# Patient Record
Sex: Female | Born: 1995 | Race: Black or African American | Hispanic: No | Marital: Single | Smoking: Never smoker
Health system: Southern US, Community
[De-identification: ages and names within clinical notes are randomized; demographics above are authoritative.]

## PROBLEM LIST (undated history)

## (undated) DIAGNOSIS — Z789 Other specified health status: Secondary | ICD-10-CM

## (undated) DIAGNOSIS — R000 Tachycardia, unspecified: Secondary | ICD-10-CM

## (undated) HISTORY — PX: NO PAST SURGERIES: SHX2092

---
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
## 2016-02-02 ENCOUNTER — Ambulatory Visit (INDEPENDENT_AMBULATORY_CARE_PROVIDER_SITE_OTHER): Payer: BLUE CROSS/BLUE SHIELD | Admitting: Family Medicine

## 2016-02-02 VITALS — BP 100/60 | HR 74 | Temp 98.1°F | Resp 20 | Ht 64.0 in | Wt 134.9 lb

## 2016-02-02 DIAGNOSIS — M25561 Pain in right knee: Secondary | ICD-10-CM | POA: Diagnosis not present

## 2016-02-02 DIAGNOSIS — Z3009 Encounter for other general counseling and advice on contraception: Secondary | ICD-10-CM | POA: Diagnosis not present

## 2016-02-02 DIAGNOSIS — N898 Other specified noninflammatory disorders of vagina: Secondary | ICD-10-CM | POA: Diagnosis not present

## 2016-02-02 LAB — POCT URINALYSIS DIP (MANUAL ENTRY)
BILIRUBIN UA: NEGATIVE
Bilirubin, UA: NEGATIVE
Blood, UA: NEGATIVE
Glucose, UA: NEGATIVE
Nitrite, UA: NEGATIVE
PH UA: 7.5
Spec Grav, UA: 1.015
Urobilinogen, UA: 2

## 2016-02-02 LAB — POCT WET + KOH PREP
Trich by wet prep: ABSENT
Yeast by KOH: ABSENT
Yeast by wet prep: ABSENT

## 2016-02-02 LAB — POCT URINE PREGNANCY: PREG TEST UR: NEGATIVE

## 2016-02-02 MED ORDER — CEPHALEXIN 500 MG PO CAPS: 500.0000 mg | ORAL_CAPSULE | Freq: Two times a day (BID) | ORAL | Status: DC

## 2016-02-02 MED ORDER — NORGESTIMATE-ETH ESTRADIOL 0.25-35 MG-MCG PO TABS: 1.0000 | ORAL_TABLET | Freq: Every day | ORAL | Status: DC

## 2016-02-02 NOTE — Progress Notes (Signed)
Nancy Mcconnell is a 20 y.o. female who presents to Urgent Care today for right knee pain and vaginal discharge and urinary frequency urgency and dysuria.  1) right knee pain present for 2 weeks. Patient twisted her knee 2 weeks ago. She notes pain and swelling. The pains whenever improved a bit. However she does note medial knee pain worse with activity better with rest. No radiating pain weakness or numbness fevers or chills. She's tried some ibuprofen which has helped a bit.  2) vaginal discharge: Patient has mild vaginal discharge associated with some urinary frequency and urgency and mild dysuria. She thinks she has BV. She has had BV in the past. She has had some unprotected sex recently with her boyfriend who has been tested negative for STDs. She uses the pullout method for birth control. Her last menstrual period was one week ago.   No past medical history on file. No past surgical history on file. Social History  Substance Use Topics  . Smoking status: Never Smoker   . Smokeless tobacco: Not on file  . Alcohol Use: No   ROS as above Medications: Current Outpatient Prescriptions  Medication Sig Dispense Refill  . cephALEXin (KEFLEX) 500 MG capsule Take 1 capsule (500 mg total) by mouth 2 (two) times daily. 14 capsule 0  . Diphenhyd-Hydrocort-Nystatin (FIRST-DUKES MOUTHWASH) SUSP Use as directed 5 mLs in the mouth or throat 4 (four) times daily as needed. (Patient not taking: Reported on 02/02/2016) 120 mL 1  . loperamide (IMODIUM A-D) 2 MG tablet Take 1 tablet (2 mg total) by mouth 4 (four) times daily as needed for diarrhea or loose stools. (Patient not taking: Reported on 02/02/2016) 30 tablet 0  . Multiple Vitamins-Minerals (MULTIVITAMIN WITH MINERALS) tablet Take 1 tablet by mouth daily. Reported on 02/02/2016    . norgestimate-ethinyl estradiol (ORTHO-CYCLEN,SPRINTEC,PREVIFEM) 0.25-35 MG-MCG tablet Take 1 tablet by mouth daily. Use as directed. 1 Package 11   No current  facility-administered medications for this visit.   No Known Allergies   Exam:  BP 100/60 mmHg  Pulse 74  Temp(Src) 98.1 F (36.7 C) (Oral)  Resp 20  Ht  (1.626 m)  Wt 134 lb 14.4 oz (61.19 kg)  BMI 23.14 kg/m2  SpO2 99%  LMP 01/29/2016 Gen: Well NAD HEENT: EOMI,  MMM Lungs: Normal work of breathing. CTABL Heart: RRR no MRG Abd: NABS, Soft. Nondistended, Nontender Exts: Brisk capillary refill, warm and well perfused.  GYN: Normal external genitalia. No visible discharge. Patient declines speculum vaginal exam. Blind wet prep swab was obtained.  Right knee: Normal-appearing no effusion Range of motion 0-120 with no crepitations. Mildly tender to palpation medial joint line nontender laterally. Stable ligamentous exam. Positive medial McMurray's test.  Results for orders placed or performed in visit on 02/02/16 (from the past 24 hour(s))  POCT urine pregnancy     Status: Normal   Collection Time: 02/02/16  4:57 PM  Result Value Ref Range   Preg Test, Ur Negative Negative  POCT urinalysis dipstick     Status: Abnormal   Collection Time: 02/02/16  4:58 PM  Result Value Ref Range   Color, UA yellow yellow   Clarity, UA clear clear   Glucose, UA negative negative   Bilirubin, UA negative negative   Ketones, POC UA negative negative   Spec Grav, UA 1.015    Blood, UA negative negative   pH, UA 7.5    Protein Ur, POC trace (A) negative   Urobilinogen, UA 2.0  Nitrite, UA Negative Negative   Leukocytes, UA small (1+) (A) Negative  POCT Wet + KOH Prep     Status: Abnormal   Collection Time: 02/02/16  5:05 PM  Result Value Ref Range   Yeast by KOH Absent Present, Absent   Yeast by wet prep Absent Present, Absent   WBC by wet prep Few None, Few, Too numerous to count   Clue Cells Wet Prep HPF POC None None, Too numerous to count   Trich by wet prep Absent Present, Absent   Bacteria Wet Prep HPF POC Moderate (A) None, Few, Too numerous to count   Epithelial Cells  By [employer] Financial[employer]et Pref ([facility]) Many (A) None, Few, Too numerous to count   RBC,UR,HPF,POC None None RBC/hpf   No results found.  Assessment and Plan: 20 y.o. female with   1) right knee pain: Concerning for meniscus injury. Follow up with sports medicine for x-ray and likely MRI.  2) dysuria: Likely UTI based on small leukocyte esterase. Treat empirically with Keflex. Urine culture pending.  3) Wet prep normal. Pt will benefit from Urine CG/Chlamydia the follow-up visit  Discussed warning signs or symptoms. Please see discharge instructions. Patient expresses understanding.

## 2016-02-02 NOTE — Patient Instructions (Addendum)
Thank you for coming in today. Follow up with me soon.   Follow up with me Dr [facility] Address: [address] [place], [place] [address] Phone: [phone]   Start birth control pills before he becomes sexually active. We'll be happy to discuss further birth control options at your follow-up visit.  Looks like you have a urinary tract infection. Take Keflex twice daily for one week   Meniscus Tear With Phase I Rehab The meniscus is a C-shaped cartilage structure, located in the knee joint between the thigh bone (femur) and the shinbone (tibia). Two menisci are located in each knee joint: the inner and outer meniscus. The meniscus acts as an adapter between the thigh bone and shinbone, allowing them to fit properly together. It also functions as a shock absorber, to reduce the stress placed on the knee joint and to help supply nutrients to the knee joint cartilage. As people age, the meniscus begins to harden and become more vulnerable to injury. Meniscus tears are a common injury, especially in older athletes. Inner meniscus tears are more common than outer meniscus tears.  SYMPTOMS   Pain in the knee, especially with standing or squatting with the affected leg.  Tenderness along the joint line.  Swelling in the knee joint (effusion), usually starting 1 to 2 days after injury.  Locking or catching of the knee joint, causing inability to straighten the knee completely.  Giving way or buckling of the knee. CAUSES  A meniscus tear occurs when a force is placed on the meniscus that is greater than it can handle. Common causes of injury include:  Direct hit (trauma) to the knee.  Twisting, pivoting, or cutting (rapidly changing direction while running), kneeling or squatting.  Without injury, due to aging. RISK INCREASES WITH:  Contact sports (football, rugby).  Sports in which cleats are used with pivoting (soccer, lacrosse) or sports in which good  shoe grip and sudden change in direction are required (racquetball, basketball, squash).  Previous knee injury.  Associated knee injury, particularly ligament injuries.  Poor strength and flexibility. PREVENTION  Warm up and stretch properly before activity.  Maintain physical fitness:  Strength, flexibility, and endurance.  Cardiovascular fitness.  Protect the knee with a brace or elastic bandage.  Wear properly fitted protective equipment (proper cleats for the surface). PROGNOSIS  Sometimes, meniscus tears heal on their own. However, definitive treatment requires surgery, followed by at least 6 weeks of recovery.  RELATED COMPLICATIONS   Recurring symptoms that result in a chronic problem.  Repeated knee injury, especially if sports are resumed too soon after injury or surgery.  Progression of the tear (the tear gets larger), if untreated.  Arthritis of the knee in later years (with or without surgery).  Complications of surgery, including infection, bleeding, injury to nerves (numbness, weakness, paralysis) continued pain, giving way, locking, nonhealing of meniscus (if repaired), need for further surgery, and knee stiffness (loss of motion). TREATMENT  Treatment first involves the use of ice and medicine, to reduce pain and inflammation. You may find using crutches to walk more comfortable. However, it is okay to bear weight on the injured knee, if the pain will allow it. Surgery is often advised as a definitive treatment. Surgery is performed through an incision near the joint (arthroscopically). The torn piece of the meniscus is removed, and if possible the joint cartilage is repaired. After surgery, the joint must be restrained. After restraint, it is important to perform strengthening and  stretching exercises to help regain strength and a full range of motion. These exercises may be completed at home or with a therapist.  MEDICATION  If pain medicine is needed,  nonsteroidal anti-inflammatory medicines (aspirin and ibuprofen), or other minor pain relievers (acetaminophen), are often advised.  Do not take pain medicine for 7 days before surgery.  Prescription pain relievers may be given, if your caregiver thinks they are needed. Use only as directed and only as much as you need. HEAT AND COLD  Cold treatment (icing) should be applied for 10 to 15 minutes every 2 to 3 hours for inflammation and pain, and immediately after activity that aggravates your symptoms. Use ice packs or an ice massage.  Heat treatment may be used before performing stretching and strengthening activities prescribed by your caregiver, physical therapist, or athletic trainer. Use a heat pack or a warm water soak. SEEK MEDICAL CARE IF:   Symptoms get worse or do not improve in 2 weeks, despite treatment.  New, unexplained symptoms develop. (Drugs used in treatment may produce side effects.) EXERCISES RANGE OF MOTION (ROM) AND STRETCHING EXERCISES - Meniscus Tear, Non-operative, Phase I These are some of the initial exercises with which you may start your rehabilitation program, until you see your caregiver again or until your symptoms are resolved. Remember:   These initial exercises are intended to be gentle. They will help you restore motion without increasing any swelling.  Completing these exercises allows less painful movement and prepares you for the more aggressive strengthening exercises in Phase II.  An effective stretch should be held for at least 30 seconds.  A stretch should never be painful. You should only feel a gentle lengthening or release in the stretched tissue. RANGE OF MOTION - Knee Flexion, Active  Lie on your back with both knees straight. (If this causes back discomfort, bend your healthy knee, placing your foot flat on the floor.)  Slowly slide your heel back toward your buttocks until you feel a gentle stretch in the front of your knee or  thigh.  Hold for __________ seconds. Slowly slide your heel back to the starting position. Repeat __________ times. Complete this exercise __________ times per day.  RANGE OF MOTION - Knee Flexion and Extension, Active-Assisted  Sit on the edge of a table or chair with your thighs firmly supported. It may be helpful to place a folded towel under the end of your right / left thigh.  Flexion (bending): Place the ankle of your healthy leg on top of the other ankle. Use your healthy leg to gently bend your right / left knee until you feel a mild tension across the top of your knee.  Hold for __________ seconds.  Extension (straightening): Switch your ankles so your right / left leg is on top. Use your healthy leg to straighten your right / left knee until you feel a mild tension on the backside of your knee.  Hold for __________ seconds. Repeat __________ times. Complete __________ times per day. STRETCH - Knee Flexion, Supine  Lie on the floor with your right / left heel and foot lightly touching the wall. (Place both feet on the wall if you do not use a door frame.)  Without using any effort, allow gravity to slide your foot down the wall slowly until you feel a gentle stretch in the front of your right / left knee.  Hold this stretch for __________ seconds. Then return the leg to the starting position, using your healthy  leg for help, if needed. Repeat __________ times. Complete this stretch __________ times per day.  STRETCH - Knee Extension Sitting  Sit with your right / left leg/heel propped on another chair, coffee table, or foot stool.  Allow your leg muscles to relax, letting gravity straighten out your knee.*  You should feel a stretch behind your right / left knee. Hold this position for __________ seconds. Repeat __________ times. Complete this stretch __________ times per day.  *Your physician, physical therapist or athletic trainer may instruct you place a __________ weight  on your thigh, just above your kneecap, to deepen the stretch.  STRENGTHENING EXERCISES - Meniscus Tear, Non-operative, Phase I These exercises may help you when beginning to rehabilitate your injury. They may resolve your symptoms with or without further involvement from your physician, physical therapist or athletic trainer. While completing these exercises, remember:   Muscles can gain both the endurance and the strength needed for everyday activities through controlled exercises.  Complete these exercises as instructed by your physician, physical therapist or athletic trainer. Progress the resistance and repetitions only as guided. STRENGTH - Quadriceps, Isometrics  Lie on your back with your right / left leg extended and your opposite knee bent.  Gradually tense the muscles in the front of your right / left thigh. You should see either your knee cap slide up toward your hip or increased dimpling just above the knee. This motion will push the back of the knee down toward the floor, mat, or bed on which you are lying.  Hold the muscle as tight as you can, without increasing your pain, for __________ seconds.  Relax the muscles slowly and completely between each repetition. Repeat __________ times. Complete this exercise __________ times per day.  STRENGTH - Quadriceps, Short Arcs   Lie on your back. Place a __________ inch towel roll under your right / left knee, so that the knee bends slightly.  Raise only your lower leg by tightening the muscles in the front of your thigh. Do not allow your thigh to rise.  Hold this position for __________ seconds. Repeat __________ times. Complete this exercise __________ times per day.  OPTIONAL ANKLE WEIGHTS: Begin with ____________________, but DO NOT exceed ____________________. Increase in 1 pound/0.5 kilogram increments. STRENGTH - Quadriceps, Straight Leg Raises  Quality counts! Watch for signs that the quadriceps muscle is working, to be sure  you are strengthening the correct muscles and not "cheating" by substituting with healthier muscles.  Lay on your back with your right / left leg extended and your opposite knee bent.  Tense the muscles in the front of your right / left thigh. You should see either your knee cap slide up or increased dimpling just above the knee. Your thigh may even shake a bit.  Tighten these muscles even more and raise your leg 4 to 6 inches off the floor. Hold for __________ seconds.  Keeping these muscles tense, lower your leg.  Relax the muscles slowly and completely in between each repetition. Repeat __________ times. Complete this exercise __________ times per day.  STRENGTH - Hamstring, Curls   Lay on your stomach with your legs extended. (If you lay on a bed, your feet may hang over the edge.)  Tighten the muscles in the back of your thigh to bend your right / left knee up to 90 degrees. Keep your hips flat on the bed.  Hold this position for __________ seconds.  Slowly lower your leg back to the starting position.  Repeat __________ times. Complete this exercise __________ times per day.  STRENGTH - Quadriceps, Squats  Stand in a door frame so that your feet and knees are in line with the frame.  Use your hands for balance, not support, on the frame.  Slowly lower your weight, bending at the hips and knees. Keep your lower legs upright so that they are parallel with the door frame. Squat only within the range that does not increase your knee pain. Never let your hips drop below your knees.  Slowly return upright, pushing with your legs, not pulling with your hands. Repeat __________ times. Complete this exercise __________ times per day.  STRENGTH - Quad/VMO, Isometric   Sit in a chair with your right / left knee slightly bent. With your fingertips, feel the VMO muscle just above the inside of your knee. The VMO is important in controlling the position of your kneecap.  Keeping your  fingertips on this muscle. Without actually moving your leg, attempt to drive your knee down as if straightening your leg. You should feel your VMO tense. If you have a difficult time, you may wish to try the same exercise on your healthy knee first.  Tense this muscle as hard as you can without increasing any knee pain.  Hold for __________ seconds. Relax the muscles slowly and completely in between each repetition. Repeat __________ times. Complete exercise __________ times per day.    This information is not intended to replace advice given to you by your health care provider. Make sure you discuss any questions you have with your health care provider.   Document Released: 09/15/1998 Document Revised: 01/16/2015 Document Reviewed: 12/14/2008 Elsevier Interactive Patient Education 2016 [employer] .   Levonorgestrel intrauterine device (IUD) What is this medicine? LEVONORGESTREL IUD (LEE voe nor jes trel) is a contraceptive (birth control) device. The device is placed inside the uterus by a healthcare professional. It is used to prevent pregnancy and can also be used to treat heavy bleeding that occurs during your period. Depending on the device, it can be used for 3 to 5 years. This medicine may be used for other purposes; ask your health care provider or pharmacist if you have questions. What should I tell my health care provider before I take this medicine? They need to know if you have any of these conditions: -abnormal Pap smear -cancer of the breast, uterus, or cervix -diabetes -endometritis -genital or pelvic infection now or in the past -have more than one sexual partner or your partner has more than one partner -heart disease -history of an ectopic or tubal pregnancy -immune system problems -IUD in place -liver disease or tumor -problems with blood clots or take blood-thinners -use intravenous drugs -uterus of unusual shape -vaginal bleeding that has not been  explained -an unusual or allergic reaction to levonorgestrel, other hormones, silicone, or polyethylene, medicines, foods, dyes, or preservatives -pregnant or trying to get pregnant -breast-feeding How should I use this medicine? This device is placed inside the uterus by a health care professional. Talk to your pediatrician regarding the use of this medicine in children. Special care may be needed. Overdosage: If you think you have taken too much of this medicine contact a poison control center or emergency room at once. NOTE: This medicine is only for you. Do not share this medicine with others. What if I miss a dose? This does not apply. What may interact with this medicine? Do not take this medicine with any of the  following medications: -amprenavir -bosentan -fosamprenavir This medicine may also interact with the following medications: -aprepitant -barbiturate medicines for inducing sleep or treating seizures -bexarotene -griseofulvin -medicines to treat seizures like carbamazepine, ethotoin, felbamate, oxcarbazepine, phenytoin, topiramate -modafinil -pioglitazone -rifabutin -rifampin -rifapentine -some medicines to treat HIV infection like atazanavir, indinavir, lopinavir, nelfinavir, tipranavir, ritonavir -St. John's wort -warfarin This list may not describe all possible interactions. Give your health care provider a list of all the medicines, herbs, non-prescription drugs, or dietary supplements you use. Also tell them if you smoke, drink alcohol, or use illegal drugs. Some items may interact with your medicine. What should I watch for while using this medicine? Visit your doctor or health care professional for regular check ups. See your doctor if you or your partner has sexual contact with others, becomes HIV positive, or gets a sexual transmitted disease. This product does not protect you against HIV infection (AIDS) or other sexually transmitted diseases. You can check  the placement of the IUD yourself by reaching up to the top of your vagina with clean fingers to feel the threads. Do not pull on the threads. It is a good habit to check placement after each menstrual period. Call your doctor right away if you feel more of the IUD than just the threads or if you cannot feel the threads at all. The IUD may come out by itself. You may become pregnant if the device comes out. If you notice that the IUD has come out use a backup birth control method like condoms and call your health care provider. Using tampons will not change the position of the IUD and are okay to use during your period. What side effects may I notice from receiving this medicine? Side effects that you should report to your doctor or health care professional as soon as possible: -allergic reactions like skin rash, itching or hives, swelling of the face, lips, or tongue -fever, flu-like symptoms -genital sores -high blood pressure -no menstrual period for 6 weeks during use -pain, swelling, warmth in the leg -pelvic pain or tenderness -severe or sudden headache -signs of pregnancy -stomach cramping -sudden shortness of breath -trouble with balance, talking, or walking -unusual vaginal bleeding, discharge -yellowing of the eyes or skin Side effects that usually do not require medical attention (report to your doctor or health care professional if they continue or are bothersome): -acne -breast pain -change in sex drive or performance -changes in weight -cramping, dizziness, or faintness while the device is being inserted -headache -irregular menstrual bleeding within first 3 to 6 months of use -nausea This list may not describe all possible side effects. Call your doctor for medical advice about side effects. You may report side effects to FDA at 1-800-FDA-1088. Where should I keep my medicine? This does not apply. NOTE: This sheet is a summary. It may not cover all possible information.  If you have questions about this medicine, talk to your doctor, pharmacist, or health care provider.    2016, Elsevier/Gold Standard. (2011-10-02 13:54:04)   Etonogestrel implant What is this medicine? ETONOGESTREL (et oh noe JES trel) is a contraceptive (birth control) device. It is used to prevent pregnancy. It can be used for up to 3 years. This medicine may be used for other purposes; ask your health care provider or pharmacist if you have questions. What should I tell my health care provider before I take this medicine? They need to know if you have any of these conditions: -abnormal vaginal bleeding -blood vessel  disease or blood clots -cancer of the breast, cervix, or liver -depression -diabetes -gallbladder disease -headaches -heart disease or recent heart attack -high blood pressure -high cholesterol -kidney disease -liver disease -renal disease -seizures -tobacco smoker -an unusual or allergic reaction to etonogestrel, other hormones, anesthetics or antiseptics, medicines, foods, dyes, or preservatives -pregnant or trying to get pregnant -breast-feeding How should I use this medicine? This device is inserted just under the skin on the inner side of your upper arm by a health care professional. Talk to your pediatrician regarding the use of this medicine in children. Special care may be needed. Overdosage: If you think you have taken too much of this medicine contact a poison control center or emergency room at once. NOTE: This medicine is only for you. Do not share this medicine with others. What if I miss a dose? This does not apply. What may interact with this medicine? Do not take this medicine with any of the following medications: -amprenavir -bosentan -fosamprenavir This medicine may also interact with the following medications: -barbiturate medicines for inducing sleep or treating seizures -certain medicines for fungal infections like ketoconazole and  itraconazole -griseofulvin -medicines to treat seizures like carbamazepine, felbamate, oxcarbazepine, phenytoin, topiramate -modafinil -phenylbutazone -rifampin -some medicines to treat HIV infection like atazanavir, indinavir, lopinavir, nelfinavir, tipranavir, ritonavir -St. John's wort This list may not describe all possible interactions. Give your health care provider a list of all the medicines, herbs, non-prescription drugs, or dietary supplements you use. Also tell them if you smoke, drink alcohol, or use illegal drugs. Some items may interact with your medicine. What should I watch for while using this medicine? This product does not protect you against HIV infection (AIDS) or other sexually transmitted diseases. You should be able to feel the implant by pressing your fingertips over the skin where it was inserted. Contact your doctor if you cannot feel the implant, and use a non-hormonal birth control method (such as condoms) until your doctor confirms that the implant is in place. If you feel that the implant may have broken or become bent while in your arm, contact your healthcare provider. What side effects may I notice from receiving this medicine? Side effects that you should report to your doctor or health care professional as soon as possible: -allergic reactions like skin rash, itching or hives, swelling of the face, lips, or tongue -breast lumps -changes in emotions or moods -depressed mood -heavy or prolonged menstrual bleeding -pain, irritation, swelling, or bruising at the insertion site -scar at site of insertion -signs of infection at the insertion site such as fever, and skin redness, pain or discharge -signs of pregnancy -signs and symptoms of a blood clot such as breathing problems; changes in vision; chest pain; severe, sudden headache; pain, swelling, warmth in the leg; trouble speaking; sudden numbness or weakness of the face, arm or leg -signs and symptoms of  liver injury like dark yellow or brown urine; general ill feeling or flu-like symptoms; light-colored stools; loss of appetite; nausea; right upper belly pain; unusually weak or tired; yellowing of the eyes or skin -unusual vaginal bleeding, discharge -signs and symptoms of a stroke like changes in vision; confusion; trouble speaking or understanding; severe headaches; sudden numbness or weakness of the face, arm or leg; trouble walking; dizziness; loss of balance or coordination Side effects that usually do not require medical attention (Report these to your doctor or health care professional if they continue or are bothersome.): -acne -back pain -breast pain -changes in  weight -dizziness -general ill feeling or flu-like symptoms -headache -irregular menstrual bleeding -nausea -sore throat -vaginal irritation or inflammation This list may not describe all possible side effects. Call your doctor for medical advice about side effects. You may report side effects to FDA at 1-800-FDA-1088. Where should I keep my medicine? This drug is given in a hospital or clinic and will not be stored at home. NOTE: This sheet is a summary. It may not cover all possible information. If you have questions about this medicine, talk to your doctor, pharmacist, or health care provider.    2016, Elsevier/Gold Standard. (2014-06-16 14:07:06)

## 2016-02-05 LAB — URINE CULTURE
COLONY COUNT: NO GROWTH
ORGANISM ID, BACTERIA: NO GROWTH

## 2016-03-25 ENCOUNTER — Ambulatory Visit: Payer: BLUE CROSS/BLUE SHIELD

## 2016-04-05 ENCOUNTER — Ambulatory Visit (INDEPENDENT_AMBULATORY_CARE_PROVIDER_SITE_OTHER): Payer: BLUE CROSS/BLUE SHIELD | Admitting: Physician Assistant

## 2016-04-05 VITALS — BP 118/66 | HR 81 | Temp 98.0°F | Resp 16 | Ht 64.0 in | Wt 134.4 lb

## 2016-04-05 DIAGNOSIS — Z202 Contact with and (suspected) exposure to infections with a predominantly sexual mode of transmission: Secondary | ICD-10-CM | POA: Diagnosis not present

## 2016-04-05 DIAGNOSIS — R350 Frequency of micturition: Secondary | ICD-10-CM | POA: Diagnosis not present

## 2016-04-05 DIAGNOSIS — Z3009 Encounter for other general counseling and advice on contraception: Secondary | ICD-10-CM

## 2016-04-05 DIAGNOSIS — N390 Urinary tract infection, site not specified: Secondary | ICD-10-CM | POA: Diagnosis not present

## 2016-04-05 DIAGNOSIS — R82998 Other abnormal findings in urine: Secondary | ICD-10-CM

## 2016-04-05 LAB — POC MICROSCOPIC URINALYSIS (UMFC): MUCUS RE: ABSENT

## 2016-04-05 LAB — POCT URINALYSIS DIP (MANUAL ENTRY)
BILIRUBIN UA: NEGATIVE
Glucose, UA: NEGATIVE
Ketones, POC UA: NEGATIVE
NITRITE UA: NEGATIVE
PH UA: 5.5
PROTEIN UA: NEGATIVE
RBC UA: NEGATIVE
Spec Grav, UA: 1.015
UROBILINOGEN UA: 0.2

## 2016-04-05 LAB — POCT URINE PREGNANCY: PREG TEST UR: NEGATIVE

## 2016-04-05 LAB — HIV ANTIBODY (ROUTINE TESTING W REFLEX): HIV: NONREACTIVE

## 2016-04-05 MED ORDER — AZITHROMYCIN 250 MG PO TABS: 1000.0000 mg | ORAL_TABLET | Freq: Once | ORAL | Status: DC

## 2016-04-05 NOTE — Patient Instructions (Addendum)
     IF you received an x-ray today, you will receive an invoice from [facility]. Please contact [facility] at [phone] with questions or concerns regarding your invoice.   IF you received labwork today, you will receive an invoice from [employer]. Please contact [facility] at [phone] with questions or concerns regarding your invoice.   Our billing staff will not be able to assist you with questions regarding bills from these companies.  You will be contacted with the lab results as soon as they are available. The fastest way to get your results is to activate your My Chart account. Instructions are located on the last page of this paperwork. If you have not heard from [place] regarding the results in 2 weeks, please contact this office.    Please take the antibiotic once.  I will be in touch with your lab results. I will also talk with the nurse to arrange the nexplanon.

## 2016-04-05 NOTE — Progress Notes (Signed)
[facility] [address], [place] [address] [phone]  Date:  04/05/2016   Name:  Nancy Mcconnell   DOB:  08-Sep-1996   MRN:  [id number]  PCP:  Pcp Not In System    History of Present Illness:  Nancy Mcconnell is a 20 y.o. female patient who presents to [facility] for vaginal discharge.     Frequent urination--2.5 weeks.  No pruritic.  No pain with urination.  No hematuria, or dysuria.  She is having abdominal pain.  She was treated by [facility] urgent care with metronidazole.   She reports that she was told that her boyfriend had chlamydia from a sexual encounter he had in may or June.  She has since been sexually active with him.  Without a condom.  No ocp.  Does not planning nor wants a family at this time She denies abnormal vaginal discharge.  [place] 6/25 he was sleeping with an individual. No hx of blood clots or bleeding disorders.  She is inquiring about the nexplanon at this time.     Patient Active Problem List   Diagnosis Date Noted  . Right knee pain 02/02/2016    History reviewed. No pertinent past medical history.  History reviewed. No pertinent past surgical history.  Social History  Substance Use Topics  . Smoking status: Never Smoker   . Smokeless tobacco: None  . Alcohol Use: No    History reviewed. No pertinent family history.  No Known Allergies  Medication list has been reviewed and updated.  Current Outpatient Prescriptions on File Prior to Visit  Medication Sig Dispense Refill  . loperamide (IMODIUM A-D) 2 MG tablet Take 1 tablet (2 mg total) by mouth 4 (four) times daily as needed for diarrhea or loose stools. (Patient not taking: Reported on 04/05/2016) 30 tablet 0  . norgestimate-ethinyl estradiol (ORTHO-CYCLEN,SPRINTEC,PREVIFEM) 0.25-35 MG-MCG tablet Take 1 tablet by mouth daily. Use as directed. (Patient not taking: Reported on 04/05/2016) 1 Package 11   No current facility-administered medications on file prior to visit.     ROS ROS otherwise unremarkable unless listed above.   Physical Examination: BP 118/66 mmHg  Pulse 81  Temp(Src) 98 F (36.7 C) (Oral)  Resp 16  Ht 5\' 4"  (1.626 m)  Wt 134 lb 6.4 oz (60.963 kg)  BMI 23.06 kg/m2  SpO2 100%  LMP 04/01/2016 Ideal Body Weight: Weight in (lb) to have BMI = 25: 145.3  Physical Exam  Constitutional: She is oriented to person, place, and time. She appears well-developed and well-nourished. No distress.  HENT:  Head: Normocephalic and atraumatic.  Right Ear: External ear normal.  Left Ear: External ear normal.  Eyes: Conjunctivae and EOM are normal. Pupils are equal, round, and reactive to light.  Cardiovascular: Normal rate.   Pulmonary/Chest: Effort normal. No respiratory distress.  Neurological: She is alert and oriented to person, place, and time.  Skin: She is not diaphoretic.  Psychiatric: She has a normal mood and affect. Her behavior is normal.    Results for orders placed or performed in visit on 04/05/16  POCT Microscopic Urinalysis ([facility])  Result Value Ref Range   WBC,UR,HPF,POC None None WBC/hpf   RBC,UR,HPF,POC None None RBC/hpf   Bacteria Few (A) None, Too numerous to count   Mucus Absent Absent   Epithelial Cells, UR Per Microscopy Few (A) None, Too numerous to count cells/hpf  POCT urinalysis dipstick  Result Value Ref Range   Color, UA yellow yellow   Clarity,  UA clear clear   Glucose, UA negative negative   Bilirubin, UA negative negative   Ketones, POC UA negative negative   Spec Grav, UA 1.015    Blood, UA negative negative   pH, UA 5.5    Protein Ur, POC negative negative   Urobilinogen, UA 0.2    Nitrite, UA Negative Negative   Leukocytes, UA small (1+) (A) Negative     Assessment and Plan: Nancy Mcconnell is a 20 y.o. female who is here today for cc of STD exposure. --will treat for possible chlamydia exposure.  Labs obtained today.  Due to leukocytes, will run culture.  --advised to take azithromycin now,  despite results, given poss. Of falsely negative.  Patient voiced understanding.  --will seek RN for prior authorization of nexplanon.  Discussed risk benefits of the nexplanon. Over 25 minutes spent with direct patient care of safe sex counseling, and contraception STD exposure - Plan: HIV antibody, RPR  Urinary frequency - Plan: POCT Microscopic Urinalysis ([facility]), POCT urinalysis dipstick, azithromycin (ZITHROMAX) 250 MG tablet, POCT urine pregnancy, Urine culture, GC/Chlamydia Probe Amp  Leukocytes in urine - Plan: Urine culture, GC/Chlamydia Probe Amp   [name], PA-C [facility] 04/05/2016 12:10 PM

## 2016-04-07 LAB — URINE CULTURE

## 2016-04-08 LAB — RPR

## 2016-04-08 LAB — GC/CHLAMYDIA PROBE AMP
CT PROBE, AMP APTIMA: NOT DETECTED
GC Probe RNA: NOT DETECTED

## 2016-04-11 ENCOUNTER — Telehealth: Payer: Self-pay

## 2016-04-11 NOTE — Telephone Encounter (Signed)
Discussed normal lab result with mother

## 2016-04-11 NOTE — Telephone Encounter (Signed)
PATIENT'S MOTHER (YASEMINE) IS VERY UPSET BECAUSE SHE SAID HER DAUGHTER CAME IN AND SAW STEPHANIE ENGLISH ABOUT A WEEK AGO AND THEY HAVE NOT HEARD ANYTHING BACK REGARDING HER LAB RESULTS. SHE WOULD LIKE A CALL BACK AS SOON AS POSSIBLE PLEASE. BEST PHONE [phone] (MOTHER'S NAME IS YASEMINE Lecomte AND SHE IS ON [place]'S HIPAA). MBC

## 2016-04-13 ENCOUNTER — Telehealth: Payer: Self-pay | Admitting: Physician Assistant

## 2016-04-13 NOTE — Telephone Encounter (Signed)
Can we get the nexplanon for patient

## 2016-04-16 NOTE — Telephone Encounter (Signed)
Called pt's insurance and they reported that pt's insurance was terminated on April 14, 2016. Called patient and LM to CB to get current insurance status.

## 2016-04-17 NOTE — Telephone Encounter (Signed)
Pt called back and I explained ins situation. Pt stated that she will have this ins again but it may need to be reactivated as the school year starts again. Pt will get her ins activated and give me a CB when done so that I can check on nexplanon again for her.

## 2016-04-26 NOTE — Telephone Encounter (Signed)
THIS CALL IS FROM YASEMINE (PATIENT'S MOTHER). SHE WOULD LIKE BARBARA TO KNOW THAT SHE HAS RE-ACTIVATED HER DAUGHTER'S INSURANCE AND IT IS NOW ALRIGHT TO SCHEDULE HER TO HAVE THE [name] BIRTH CONTROL. BEST PHONE [phone] ([place]'S CELL)  MBC

## 2016-04-29 NOTE — Telephone Encounter (Signed)
Called [employer] [place] back and was advised that the nexplanon device and procedure to implant are covered by pt's ins plan if performed at PCP office under Dx code for preventative care at 100%, no deductible or co-pay.  Called pt to advise and she reported that she should be getting her period this week and will call to come in as soon as it starts. Gave her Sarah's and Stephanie's schedules for the week and explained same/next day appts. Advised her to ask to see either provider above when she calls. Maralyn SagoSarah and Judeth CornfieldStephanie, [place]

## 2016-04-30 NOTE — Telephone Encounter (Signed)
Thanks

## 2016-05-01 ENCOUNTER — Encounter: Payer: BLUE CROSS/BLUE SHIELD | Admitting: Obstetrics and Gynecology

## 2016-05-02 NOTE — Telephone Encounter (Signed)
Mother called and stated that she was suppose to bring in pt for [name] but they have a balance of $69.  She cannot come today because billing people told her she had to pay full balance.  Do she need to do anything besides pay the bill?  She would like a call from you.

## 2016-05-02 NOTE — Telephone Encounter (Deleted)
Mother called and stated that she wanted to bring patient in

## 2016-05-02 NOTE — Telephone Encounter (Signed)
Left message for pt to call back  °

## 2016-05-02 NOTE — Telephone Encounter (Signed)
THIS MESSAGE IS FROM PATIENT'S MOTHER Nancy Mcconnell(Nancy Mcconnell). SHE WOULD LIKE STEPHANIE ENGLISH TO CALL HER REGARDING Nancy Mcconnell'S [name] BIRTH CONTROL. SHE WOULD NOT GIVE ANY OTHER DETAILS. BEST PHONE [phone] - Nancy MunroeYASEMINE Mcconnell (MOM)  MBC

## 2016-05-03 NOTE — Telephone Encounter (Signed)
From my knowledge, there is not else to do.  But you may want to confer with billing.   If there is a clinical question, let me know.   If there is difficulty with affording the nexplanon at this time, if she would like oral contraceptives while awaiting this, I can do that as well.

## 2016-05-05 NOTE — Telephone Encounter (Signed)
LMOM for mother w/Stephanie's message. Asked for CB if she needs to speak to the billing dept, or if Mare LoanCecilia would like to be Rxd oral contraceptives while waiting.

## 2016-05-06 ENCOUNTER — Other Ambulatory Visit: Payer: Self-pay | Admitting: Orthopedic Surgery

## 2016-05-06 DIAGNOSIS — M25561 Pain in right knee: Secondary | ICD-10-CM

## 2016-05-16 ENCOUNTER — Ambulatory Visit: Payer: BLUE CROSS/BLUE SHIELD

## 2016-05-17 ENCOUNTER — Other Ambulatory Visit (INDEPENDENT_AMBULATORY_CARE_PROVIDER_SITE_OTHER): Payer: BLUE CROSS/BLUE SHIELD | Admitting: Physician Assistant

## 2016-05-17 ENCOUNTER — Ambulatory Visit: Payer: BLUE CROSS/BLUE SHIELD | Admitting: Physician Assistant

## 2016-05-17 VITALS — BP 104/70 | HR 67 | Temp 98.6°F | Resp 16 | Ht 64.0 in | Wt 133.0 lb

## 2016-05-17 DIAGNOSIS — Z309 Encounter for contraceptive management, unspecified: Secondary | ICD-10-CM | POA: Diagnosis not present

## 2016-05-17 LAB — POCT URINE PREGNANCY: PREG TEST UR: NEGATIVE

## 2016-05-17 NOTE — Patient Instructions (Signed)
     IF you received an x-ray today, you will receive an invoice from [facility]. Please contact [facility] at [phone] with questions or concerns regarding your invoice.   IF you received labwork today, you will receive an invoice from [facility]. Please contact [facility] at [phone] with questions or concerns regarding your invoice.   Our billing staff will not be able to assist you with questions regarding bills from these companies.  You will be contacted with the lab results as soon as they are available. The fastest way to get your results is to activate your My Chart account. Instructions are located on the last page of this paperwork. If you have not heard from us regarding the results in 2 weeks, please contact this office.      

## 2016-05-23 ENCOUNTER — Other Ambulatory Visit: Payer: Self-pay

## 2016-05-24 ENCOUNTER — Other Ambulatory Visit: Payer: Self-pay

## 2016-06-03 ENCOUNTER — Telehealth: Payer: Self-pay | Admitting: Physician Assistant

## 2016-06-03 MED ORDER — NORGESTIMATE-ETH ESTRADIOL 0.25-35 MG-MCG PO TABS: 1.0000 | ORAL_TABLET | Freq: Every day | ORAL | 11 refills | Status: DC

## 2016-06-03 NOTE — Progress Notes (Signed)
She was somehow told that she could get nexplanon--when I think this was misconstrued--they were addressing her debt.  But she needs to address whether this will be covered prior to us performing quick procedure.

## 2016-06-03 NOTE — Telephone Encounter (Signed)
Can we assist her in figuring out if her insurance will cover the nexplanon.  She came in out of confusion thinking that she would be able to pay her bill and get the nexplanon.  Can we get her covered by her insurance so that she can return for this.

## 2016-06-04 ENCOUNTER — Telehealth: Payer: Self-pay

## 2016-06-04 NOTE — Telephone Encounter (Signed)
Patient request for Nancy PlattStephanie English to give her a call. [phone]. Patient was seen 2 weeks ago. Mother is calling for daughter.

## 2016-06-05 NOTE — Telephone Encounter (Signed)
Called [employer]", pt's ins, and was advised that the nexplanon device and procedure to implant it would both be covered at 100%, no deductible or co-pay. No precert needed.  Mother had called to speak to you. She was not sure why we did not have the device here and ready to do at time of OV when pt came in to have it done. It had already been approved by her insurance previously (I did check again today but had called on this the last time it was req'd of me). I advised her that I would send Judeth CornfieldStephanie the message to make sure that we have a nexplanon device in the office and to check when pt should come in for implantation. She wants to come in on Fri or Sat this week, but had her period at time of last OV a couple of weeks ago and has been on BCPs since. Please advise.

## 2016-06-05 NOTE — Telephone Encounter (Signed)
Attempted to call the mother and patient.  I do see now where [place], RN did clear this more than one month ago in the midst of other calls.  She can come in with anyone who can perform the nexplanon, however she should state I am here for a [name] visit.  Not birth control.  State that the insurance was checked.  Because this is not obvious as well.  We can not give them unless the insurance is covered, and we must have proof and the provider and appt. Should both be knowledgeable, that this is confirmed.

## 2016-06-05 NOTE — Telephone Encounter (Signed)
This is duplicate. See notes under 9/19 message.

## 2016-06-05 NOTE — Telephone Encounter (Signed)
Judeth CornfieldStephanie, see note below. Apolonio SchneidersSheketia, can you please make sure that we have a device ordered and available for pt? Thanks!

## 2016-06-05 NOTE — Telephone Encounter (Signed)
We have in stock 

## 2016-06-06 NOTE — Telephone Encounter (Signed)
I spoke to mother and advised all is ready to go for pt when she starts her period to have the nexplanon implanted. Gave her Hardie PulleyStephanies' message of what to inform front staff and provider when she comes in.

## 2016-06-07 ENCOUNTER — Telehealth: Payer: Self-pay

## 2016-06-07 NOTE — Telephone Encounter (Signed)
error 

## 2016-06-07 NOTE — Telephone Encounter (Signed)
Apt has been made

## 2016-06-07 NOTE — Telephone Encounter (Signed)
I have advised Nancy Mcconnell ok to make appt for patient to have [name] placed, Britta MccreedyBarbara has gotten PA for this already with insurance

## 2016-06-07 NOTE — Telephone Encounter (Signed)
Patient called today to schedule, have advised front desk ok to make her appt with Judeth CornfieldStephanie or Maralyn SagoSarah

## 2016-06-07 NOTE — Telephone Encounter (Signed)
Pt called asking about implanting [name] birth control. Has spoken with someone in office before and was told to call back and schedule for this specific time in her daughter's cycle.   Waiting to speak with Maralyn SagoSarah about this.  Please advise [phone]

## 2016-06-13 ENCOUNTER — Ambulatory Visit (INDEPENDENT_AMBULATORY_CARE_PROVIDER_SITE_OTHER): Payer: BLUE CROSS/BLUE SHIELD | Admitting: Physician Assistant

## 2016-06-13 VITALS — BP 110/66 | HR 82 | Temp 98.3°F | Resp 18 | Ht 64.0 in | Wt 135.0 lb

## 2016-06-13 DIAGNOSIS — N898 Other specified noninflammatory disorders of vagina: Secondary | ICD-10-CM | POA: Diagnosis not present

## 2016-06-13 DIAGNOSIS — R350 Frequency of micturition: Secondary | ICD-10-CM

## 2016-06-13 DIAGNOSIS — Z309 Encounter for contraceptive management, unspecified: Secondary | ICD-10-CM

## 2016-06-13 LAB — POCT WET + KOH PREP
TRICH BY WET PREP: ABSENT
YEAST BY KOH: ABSENT
YEAST BY WET PREP: ABSENT

## 2016-06-13 LAB — POCT URINALYSIS DIP (MANUAL ENTRY)
BILIRUBIN UA: NEGATIVE
GLUCOSE UA: NEGATIVE
Ketones, POC UA: NEGATIVE
NITRITE UA: NEGATIVE
PH UA: 5.5
Protein Ur, POC: NEGATIVE
RBC UA: NEGATIVE
Spec Grav, UA: 1.02
UROBILINOGEN UA: 0.2

## 2016-06-13 LAB — POC MICROSCOPIC URINALYSIS (UMFC): MUCUS RE: ABSENT

## 2016-06-13 LAB — POCT URINE PREGNANCY: PREG TEST UR: NEGATIVE

## 2016-06-13 MED ORDER — NORGESTIMATE-ETH ESTRADIOL 0.25-35 MG-MCG PO TABS: 1.0000 | ORAL_TABLET | Freq: Every day | ORAL | 11 refills | Status: DC

## 2016-06-13 NOTE — Progress Notes (Signed)
[facility] [address], [place] [id number] [phone]  By signing my name below, I, [name], attest that this documentation has been prepared under the direction and in the presence of [place] English, PA-C. Electronically Signed: Arvilla MarketMesha Guinyard, Medical Scribe. 06/13/16. 6:10 PM.  Date:  06/13/2016   Name:  Nancy Mcconnell   DOB:  December 10, 1995   MRN:  [id number]  PCP:  Pcp Not In System   Chief Complaint  Patient presents with   Contraception    nexplanon   Urinary Frequency    History of Present Illness:  Nancy Mcconnell Huseman is a 20 y.o. female patient [facility]. Pt reports urinary frequency onset 1.5 - 2 weeks ago. Pt reports associated symptoms of white vaginal discharge, and slight vaginal odor. Pt has not taken any medcations for relief to her symptoms. Pt has been sexually active with the use of condoms for protection every time she's had intercourse. Pt denies vaginal itchiness, dysuria, hematuria, and rash.  Patient Active Problem List   Diagnosis Date Noted   Right knee pain 02/02/2016    History reviewed. No pertinent past medical history.  History reviewed. No pertinent surgical history.  Social History  Substance Use Topics   Smoking status: Never Smoker   Smokeless tobacco: Never Used   Alcohol use No    History reviewed. No pertinent family history.  No Known Allergies  Medication list has been reviewed and updated.  No current outpatient prescriptions on file prior to visit.   No current facility-administered medications on file prior to visit.     Review of Systems  Genitourinary: Positive for frequency. Negative for dysuria and hematuria.  Skin: Negative for itching and rash.    Physical Examination: BP 110/66 (BP Location: Right Arm, Patient Position: Sitting, Cuff Size: Small)    Pulse 82    Temp 98.3 F (36.8 C) (Oral)    Resp 18    Ht 5\' 4"  (1.626 m)    Wt 135  lb (61.2 kg)    LMP 05/25/2016    SpO2 98%    BMI 23.17 kg/m  Ideal Body Weight: @FLOWAMB ([id number])@([phone])@  Physical Exam  Constitutional: She is oriented to person, place, and time. She appears well-developed and well-nourished. No distress.  HENT:  Head: Normocephalic and atraumatic.  Right Ear: External ear normal.  Left Ear: External ear normal.  Eyes: Conjunctivae and EOM are normal. Pupils are equal, round, and reactive to light.  Cardiovascular: Normal rate.   Pulmonary/Chest: Effort normal. No respiratory distress.  Genitourinary: Vaginal discharge found.  Genitourinary Comments: Vaginal discharge can be seen at the entrance of the vaginal canal  Neurological: She is alert and oriented to person, place, and time.  Skin: She is not diaphoretic.  Psychiatric: She has a normal mood and affect. Her behavior is normal.    Results for orders placed or performed in visit on 06/13/16  POCT urine pregnancy  Result Value Ref Range   Preg Test, Ur Negative Negative  POCT Microscopic Urinalysis ([facility])  Result Value Ref Range   WBC,UR,HPF,POC Moderate (A) None WBC/hpf   RBC,UR,HPF,POC None None RBC/hpf   Bacteria Moderate (A) None, Too numerous to count   Mucus Absent Absent   Epithelial Cells, UR Per Microscopy Few (A) None, Too numerous to count cells/hpf  POCT urinalysis dipstick  Result Value Ref Range   Color, UA yellow yellow   Clarity, UA clear clear   Glucose, UA  negative negative   Bilirubin, UA negative negative   Ketones, POC UA negative negative   Spec Grav, UA 1.020    Blood, UA negative negative   pH, UA 5.5    Protein Ur, POC negative negative   Urobilinogen, UA 0.2    Nitrite, UA Negative Negative   Leukocytes, UA Trace (A) Negative   Procedure: Verbal consent obtained. A ruler was utilized to place a Marcaine 9 cm proximally from the medial epicondyle. A second marking was placed a few centimeters from this proximally as a target area for implant at the left  arm/nondominant.  The area was then white count with an alcohol swab. 1% lidocaine was injected into the area after quick review of ensuring that this would not be placed in the sulcus groove.  Area was then swabbed 3 times with povidone-iodine. Insertion tip was placed at the marker and direct it towards the second more proximal area and inserted subdermally. [name] was then inserted directly into the area and released. The procedure site was cleansed with normal saline and pressure dressings were then applied.   Assessment and Plan: [name] is a 20 y.o. female who is here today for nexplanon, and for urinary frequency. Differential diagnosis includes urinary tract infection versus gonorrhea/chlamydia. Urine culture and probe obtained today. We'll follow-up as needed. Patient was advised to use a protective barrier to prevent pregnancy at this time as well as to prevent STDs. Patient voiced understanding. Encounter for contraceptive management, unspecified encounter - Plan: POCT urine pregnancy, norgestimate-ethinyl estradiol (ORTHO-CYCLEN,SPRINTEC,PREVIFEM) 0.25-35 MG-MCG tablet, POCT Wet + KOH Prep, Urine culture  Urine frequency - Plan: POCT Microscopic Urinalysis ([facility]), POCT urinalysis dipstick, norgestimate-ethinyl estradiol (ORTHO-CYCLEN,SPRINTEC,PREVIFEM) 0.25-35 MG-MCG tablet, POCT Wet + KOH Prep, Urine culture, GC/Chlamydia Probe Amp, GC/Chlamydia Probe Amp  Vaginal discharge - Plan: GC/Chlamydia Probe Amp, GC/Chlamydia Probe Amp  [name], PA-C [facility] 9/30/201710:48 PM  I personally performed the services described in this documentation, which was scribed in my presence. The recorded information has been reviewed and is accurate.

## 2016-06-13 NOTE — Patient Instructions (Addendum)
Please hydrate well with water. I will follow up with you with the lab results.  You can return the urine sample tomorrow, OR wait until the urine culture returns.  I will contact you when I get the results.   IF you received an x-ray today, you will receive an invoice from [facility]. Please contact [facility] at [phone] with questions or concerns regarding your invoice.   IF you received labwork today, you will receive an invoice from [facility]. Please contact [facility] at [phone] with questions or concerns regarding your invoice.   Our billing staff will not be able to assist you with questions regarding bills from these companies.  You will be contacted with the lab results as soon as they are available. The fastest way to get your results is to activate your My Chart account. Instructions are located on the last page of this paperwork. If you have not heard from us regarding the results in 2 weeks, please contact this office.    Etonogestrel implant What is this medicine? ETONOGESTREL (et oh noe JES trel) is a contraceptive (birth control) device. It is used to prevent pregnancy. It can be used for up to 3 years. This medicine may be used for other purposes; ask your health care provider or pharmacist if you have questions. What should I tell my health care provider before I take this medicine? They need to know if you have any of these conditions: -abnormal vaginal bleeding -blood vessel disease or blood clots -cancer of the breast, cervix, or liver -depression -diabetes -gallbladder disease -headaches -heart disease or recent heart attack -high blood pressure -high cholesterol -kidney disease -liver disease -renal disease -seizures -tobacco smoker -an unusual or allergic reaction to etonogestrel, other hormones, anesthetics or antiseptics, medicines, foods, dyes, or preservatives -pregnant or trying to get  pregnant -breast-feeding How should I use this medicine? This device is inserted just under the skin on the inner side of your upper arm by a health care professional. Talk to your pediatrician regarding the use of this medicine in children. Special care may be needed. Overdosage: If you think you have taken too much of this medicine contact a poison control center or emergency room at once. NOTE: This medicine is only for you. Do not share this medicine with others. What if I miss a dose? This does not apply. What may interact with this medicine? Do not take this medicine with any of the following medications: -amprenavir -bosentan -fosamprenavir This medicine may also interact with the following medications: -barbiturate medicines for inducing sleep or treating seizures -certain medicines for fungal infections like ketoconazole and itraconazole -griseofulvin -medicines to treat seizures like carbamazepine, felbamate, oxcarbazepine, phenytoin, topiramate -modafinil -phenylbutazone -rifampin -some medicines to treat HIV infection like atazanavir, indinavir, lopinavir, nelfinavir, tipranavir, ritonavir -St. John's wort This list may not describe all possible interactions. Give your health care provider a list of all the medicines, herbs, non-prescription drugs, or dietary supplements you use. Also tell them if you smoke, drink alcohol, or use illegal drugs. Some items may interact with your medicine. What should I watch for while using this medicine? This product does not protect you against HIV infection (AIDS) or other sexually transmitted diseases. You should be able to feel the implant by pressing your fingertips over the skin where it was inserted. Contact your doctor if you cannot feel the implant, and use a non-hormonal birth control method (such as condoms) until your doctor confirms that the implant is  in place. If you feel that the implant may have broken or become bent while in  your arm, contact your healthcare provider. What side effects may I notice from receiving this medicine? Side effects that you should report to your doctor or health care professional as soon as possible: -allergic reactions like skin rash, itching or hives, swelling of the face, lips, or tongue -breast lumps -changes in emotions or moods -depressed mood -heavy or prolonged menstrual bleeding -pain, irritation, swelling, or bruising at the insertion site -scar at site of insertion -signs of infection at the insertion site such as fever, and skin redness, pain or discharge -signs of pregnancy -signs and symptoms of a blood clot such as breathing problems; changes in vision; chest pain; severe, sudden headache; pain, swelling, warmth in the leg; trouble speaking; sudden numbness or weakness of the face, arm or leg -signs and symptoms of liver injury like dark yellow or brown urine; general ill feeling or flu-like symptoms; light-colored stools; loss of appetite; nausea; right upper belly pain; unusually weak or tired; yellowing of the eyes or skin -unusual vaginal bleeding, discharge -signs and symptoms of a stroke like changes in vision; confusion; trouble speaking or understanding; severe headaches; sudden numbness or weakness of the face, arm or leg; trouble walking; dizziness; loss of balance or coordination Side effects that usually do not require medical attention (Report these to your doctor or health care professional if they continue or are bothersome.): -acne -back pain -breast pain -changes in weight -dizziness -general ill feeling or flu-like symptoms -headache -irregular menstrual bleeding -nausea -sore throat -vaginal irritation or inflammation This list may not describe all possible side effects. Call your doctor for medical advice about side effects. You may report side effects to FDA at 1-800-FDA-1088. Where should I keep my medicine? This drug is given in a hospital or  clinic and will not be stored at home. NOTE: This sheet is a summary. It may not cover all possible information. If you have questions about this medicine, talk to your doctor, pharmacist, or health care provider.    2016, Elsevier/Gold Standard. (2014-06-16 14:07:06)

## 2016-06-14 MED ORDER — ETONOGESTREL 68 MG ~~LOC~~ IMPL: 68.0000 mg | DRUG_IMPLANT | Freq: Once | SUBCUTANEOUS | Status: AC

## 2016-06-16 LAB — URINE CULTURE

## 2016-06-17 LAB — GC/CHLAMYDIA PROBE AMP
CT Probe RNA: NOT DETECTED
GC PROBE AMP APTIMA: NOT DETECTED

## 2016-09-01 ENCOUNTER — Ambulatory Visit: Payer: BLUE CROSS/BLUE SHIELD

## 2016-09-03 ENCOUNTER — Ambulatory Visit: Payer: BLUE CROSS/BLUE SHIELD

## 2016-09-04 ENCOUNTER — Ambulatory Visit: Payer: BLUE CROSS/BLUE SHIELD | Admitting: Physician Assistant

## 2016-09-05 ENCOUNTER — Ambulatory Visit (INDEPENDENT_AMBULATORY_CARE_PROVIDER_SITE_OTHER): Payer: BLUE CROSS/BLUE SHIELD | Admitting: Physician Assistant

## 2016-09-05 VITALS — BP 122/72 | HR 84 | Temp 98.4°F | Resp 17 | Ht 66.0 in | Wt 132.0 lb

## 2016-09-05 DIAGNOSIS — R5383 Other fatigue: Secondary | ICD-10-CM | POA: Diagnosis not present

## 2016-09-05 DIAGNOSIS — Z789 Other specified health status: Secondary | ICD-10-CM | POA: Diagnosis not present

## 2016-09-05 LAB — POCT CBC
GRANULOCYTE PERCENT: 57.4 % (ref 37–80)
HCT, POC: 38.8 % (ref 37.7–47.9)
Hemoglobin: 13.7 g/dL (ref 12.2–16.2)
Lymph, poc: 2.1 (ref 0.6–3.4)
MCH, POC: 28.8 pg (ref 27–31.2)
MCHC: 35.2 g/dL (ref 31.8–35.4)
MCV: 81.7 fL (ref 80–97)
MID (cbc): 0.6 (ref 0–0.9)
MPV: 7.6 fL (ref 0–99.8)
PLATELET COUNT, POC: 241 10*3/uL (ref 142–424)
POC Granulocyte: 3.7 (ref 2–6.9)
POC LYMPH %: 32.8 % (ref 10–50)
POC MID %: 9.8 %M (ref 0–12)
RBC: 4.74 M/uL (ref 4.04–5.48)
RDW, POC: 12.9 %
WBC: 6.5 10*3/uL (ref 4.6–10.2)

## 2016-09-05 LAB — POC MICROSCOPIC URINALYSIS (UMFC): MUCUS RE: ABSENT

## 2016-09-05 LAB — POCT URINALYSIS DIP (MANUAL ENTRY)
BILIRUBIN UA: NEGATIVE
BILIRUBIN UA: NEGATIVE
Blood, UA: NEGATIVE
GLUCOSE UA: NEGATIVE
LEUKOCYTES UA: NEGATIVE
Nitrite, UA: NEGATIVE
Protein Ur, POC: NEGATIVE
Spec Grav, UA: 1.015
Urobilinogen, UA: 1
pH, UA: 7

## 2016-09-05 LAB — POCT WET + KOH PREP
Trich by wet prep: ABSENT
YEAST BY WET PREP: ABSENT
Yeast by KOH: ABSENT

## 2016-09-05 NOTE — Progress Notes (Signed)
[facility] [address], [place] [address] [phone]  Date:  09/05/2016   Name:  Nancy PomfretCecilia D Mcconnell   DOB:  14-Sep-1996   MRN:  [id number]  PCP:  Pcp Not In System    History of Present Illness:  Nancy Mcconnell is a 20 y.o. female patient who [facility].  --patient was seen here almost 3 months, for implant of the nexplanon.  This was done successfully.  Patient has a new complaint of spotting.  This occurs almost every day.  There is not heavy bleeding.  Enough for a panty-liner, and at moments a sanitary napkin.  She has noticed increased fatigue.  She has no fever.  No abdominal pain.   --eating regularly. --no personal or familial hx of thyroid disease.  --no hx of anemia  Patient Active Problem List   Diagnosis Date Noted  . Right knee pain 02/02/2016    No past medical history on file.  No past surgical history on file.  Social History  Substance Use Topics  . Smoking status: Never Smoker  . Smokeless tobacco: Never Used  . Alcohol use No    No family history on file.  No Known Allergies  Medication list has been reviewed and updated.  Current Outpatient Prescriptions on File Prior to Visit  Medication Sig Dispense Refill  . norgestimate-ethinyl estradiol (ORTHO-CYCLEN,SPRINTEC,PREVIFEM) 0.25-35 MG-MCG tablet Take 1 tablet by mouth daily. Use as directed. (Patient not taking: Reported on 09/05/2016) 1 Package 11   Current Facility-Administered Medications on File Prior to Visit  Medication Dose Route Frequency Provider Last Rate Last Dose  . etonogestrel ([name]) implant 68 mg  68 mg Subdermal Once Mykelle Cockerell D Avamarie Crossley, PA        ROS ROS otherwise unremarkable unless listed above  Physical Examination: BP 122/72 (BP Location: Right Arm, Patient Position: Sitting, Cuff Size: Normal)   Pulse 84   Temp 98.4 F (36.9 C) (Oral)   Resp 17   Ht 5\' 6"  (1.676 m)   Wt 132 lb (59.9 kg)   LMP 08/22/2016  (Approximate)   SpO2 97%   BMI 21.31 kg/m  Ideal Body Weight: Weight in (lb) to have BMI = 25: 154.6  Physical Exam  Constitutional: She is oriented to person, place, and time. She appears well-developed and well-nourished. No distress.  HENT:  Head: Normocephalic and atraumatic.  Right Ear: External ear normal.  Left Ear: External ear normal.  Eyes: Conjunctivae and EOM are normal. Pupils are equal, round, and reactive to light.  Neck: No thyroid mass and no thyromegaly present.  Cardiovascular: Normal rate.   Pulmonary/Chest: Effort normal. No respiratory distress.  Neurological: She is alert and oriented to person, place, and time.  Skin: She is not diaphoretic.  Psychiatric: She has a normal mood and affect. Her behavior is normal.   Results for orders placed or performed in visit on 09/05/16  TSH  Result Value Ref Range   TSH 2.510 0.450 - 4.500 uIU/mL  POCT CBC  Result Value Ref Range   WBC 6.5 4.6 - 10.2 K/uL   Lymph, poc 2.1 0.6 - 3.4   POC LYMPH PERCENT 32.8 10 - 50 %L   MID (cbc) 0.6 0 - 0.9   POC MID % 9.8 0 - 12 %M   POC Granulocyte 3.7 2 - 6.9   Granulocyte percent 57.4 37 - 80 %G   RBC 4.74 4.04 - 5.48 M/uL   Hemoglobin  13.7 12.2 - 16.2 g/dL   HCT, POC 40.938.8 81.137.7 - 47.9 %   MCV 81.7 80 - 97 fL   MCH, POC 28.8 27 - 31.2 pg   MCHC 35.2 31.8 - 35.4 g/dL   RDW, POC 91.412.9 %   Platelet Count, POC 241 142 - 424 K/uL   MPV 7.6 0 - 99.8 fL  POCT urinalysis dipstick  Result Value Ref Range   Color, UA yellow yellow   Clarity, UA clear clear   Glucose, UA negative negative   Bilirubin, UA negative negative   Ketones, POC UA negative negative   Spec Grav, UA 1.015    Blood, UA negative negative   pH, UA 7.0    Protein Ur, POC negative negative   Urobilinogen, UA 1.0    Nitrite, UA Negative Negative   Leukocytes, UA Negative Negative  POCT Microscopic Urinalysis ([facility])  Result Value Ref Range   WBC,UR,HPF,POC None None WBC/hpf   RBC,UR,HPF,POC None None  RBC/hpf   Bacteria None None, Too numerous to count   Mucus Absent Absent   Epithelial Cells, UR Per Microscopy Few (A) None, Too numerous to count cells/hpf  POCT Wet + KOH Prep  Result Value Ref Range   Yeast by KOH Absent Absent   Yeast by wet prep Absent Absent   WBC by wet prep Few Few   Clue Cells Wet Prep HPF POC None None   Trich by wet prep Absent Absent   Bacteria Wet Prep HPF POC Few Few   Epithelial Cells By [employer] Financial[employer]et Pref ([facility]) Few None, Few, Too numerous to count   RBC,UR,HPF,POC None None RBC/hpf      Assessment and Plan: Nancy Mcconnell is a 20 y.o. female who is here today for fatigue and spotting after installment of nexplanon. This is common symptoms/reaction to the nexplanon.  Advised to continue for 2 more months. Vitals and cbc wnl.  Will check thyroid as well.  Wet prep looks unalarming. Other fatigue - Plan: POCT CBC, POCT urinalysis dipstick, POCT Microscopic Urinalysis ([facility]), POCT Wet + KOH Prep, TSH  Uses condoms for contraception - Plan: POCT CBC, POCT urinalysis dipstick, POCT Microscopic Urinalysis ([facility]), POCT Wet + KOH Prep  Nancy PlattStephanie Jarad Barth, PA-C [facility] 1/9/20189:39 AM  Nancy PlattStephanie Pascuala Klutts, PA-C [facility] 09/05/2016 12:55 PM

## 2016-09-05 NOTE — Patient Instructions (Addendum)
  This appears normal. Your labs were not alarming.  Let's try a probiotic containing lactobacillus, to address, vaginal and urinary symptoms.   IF you received an x-ray today, you will receive an invoice from [facility]. Please contact [facility] at [phone] with questions or concerns regarding your invoice.   IF you received labwork today, you will receive an invoice from [facility]. Please contact [facility] at [phone] with questions or concerns regarding your invoice.   Our billing staff will not be able to assist you with questions regarding bills from these companies.  You will be contacted with the lab results as soon as they are available. The fastest way to get your results is to activate your My Chart account. Instructions are located on the last page of this paperwork. If you have not heard from us regarding the results in 2 weeks, please contact this office.

## 2016-09-06 LAB — TSH: TSH: 2.51 u[IU]/mL (ref 0.450–4.500)

## 2017-03-26 ENCOUNTER — Ambulatory Visit (INDEPENDENT_AMBULATORY_CARE_PROVIDER_SITE_OTHER): Payer: BLUE CROSS/BLUE SHIELD | Admitting: Physician Assistant

## 2017-03-26 ENCOUNTER — Encounter: Payer: Self-pay | Admitting: Physician Assistant

## 2017-03-26 VITALS — BP 111/74 | HR 81 | Temp 98.6°F | Resp 18 | Ht 64.17 in | Wt 134.0 lb

## 2017-03-26 DIAGNOSIS — H00011 Hordeolum externum right upper eyelid: Secondary | ICD-10-CM

## 2017-03-26 MED ORDER — ERYTHROMYCIN 5 MG/GM OP OINT: 1.0000 "application " | TOPICAL_OINTMENT | Freq: Four times a day (QID) | OPHTHALMIC | 0 refills | Status: DC

## 2017-03-26 NOTE — Patient Instructions (Addendum)
Please place warm compresses at the eyelid at least 4 times per day for 15 minutes.   Apply antibiotic as prescribed. Stye A stye is a bump on your eyelid caused by a bacterial infection. A stye can form inside the eyelid (internal stye) or outside the eyelid (external stye). An internal stye may be caused by an infected oil-producing gland inside your eyelid. An external stye may be caused by an infection at the base of your eyelash (hair follicle). Styes are very common. Anyone can get them at any age. They usually occur in just one eye, but you may have more than one in either eye. What are the causes? The infection is almost always caused by bacteria called Staphylococcus aureus. This is a common type of bacteria that lives on your skin. What increases the risk? You may be at higher risk for a stye if you have had one before. You may also be at higher risk if you have:  Diabetes.  Long-term illness.  Long-term eye redness.  A skin condition called seborrhea.  High fat levels in your blood (lipids).  What are the signs or symptoms? Eyelid pain is the most common symptom of a stye. Internal styes are more painful than external styes. Other signs and symptoms may include:  Painful swelling of your eyelid.  A scratchy feeling in your eye.  Tearing and redness of your eye.  Pus draining from the stye.  How is this diagnosed? Your health care provider may be able to diagnose a stye just by examining your eye. The health care provider may also check to make sure:  You do not have a fever or other signs of a more serious infection.  The infection has not spread to other parts of your eye or areas around your eye.  How is this treated? Most styes will clear up in a few days without treatment. In some cases, you may need to use antibiotic drops or ointment to prevent infection. Your health care provider may have to drain the stye surgically if your stye is:  Large.  Causing a lot  of pain.  Interfering with your vision.  This can be done using a thin blade or a needle. Follow these instructions at home:  Take medicines only as directed by your health care provider.  Apply a clean, warm compress to your eye for 10 minutes, 4 times a day.  Do not wear contact lenses or eye makeup until your stye has healed.  Do not try to pop or drain the stye. Contact a health care provider if:  You have chills or a fever.  Your stye does not go away after several days.  Your stye affects your vision.  Your eyeball becomes swollen, red, or painful. This information is not intended to replace advice given to you by your health care provider. Make sure you discuss any questions you have with your health care provider. Document Released: 06/11/2005 Document Revised: 04/27/2016 Document Reviewed: 12/16/2013 Elsevier Interactive Patient Education  2018 [employer]     IF you received an x-ray today, you will receive an invoice from [facility]. Please contact [facility] at [phone] with questions or concerns regarding your invoice.   IF you received labwork today, you will receive an invoice from [facility]. Please contact [facility] at [phone] with questions or concerns regarding your invoice.   Our billing staff will not be able to assist you with questions regarding bills from these companies.  You will be  contacted with the lab results as soon as they are available. The fastest way to get your results is to activate your My Chart account. Instructions are located on the last page of this paperwork. If you have not heard from [place] regarding the results in 2 weeks, please contact this office.

## 2017-03-26 NOTE — Progress Notes (Signed)
[facility] [address], [place] [address] [phone]  Date:  03/26/2017   Name:  Nancy Mcconnell   DOB:  04-13-1996   MRN:  [id number]  PCP:  System, Pcp Not In    History of Present Illness:  Nancy Mcconnell is a 21 y.o. female patient who presents to PCP with  Chief Complaint  Patient presents with  . Eye Pain    right yea with some swelling x 2 days      2 days of right eye swelling and pain.  She has no known trauma.  No change in vision.  No discharge.  She does not wear contacts.    Patient Active Problem List   Diagnosis Date Noted  . Right knee pain 02/02/2016    History reviewed. No pertinent past medical history.  History reviewed. No pertinent surgical history.  Social History  Substance Use Topics  . Smoking status: Never Smoker  . Smokeless tobacco: Never Used  . Alcohol use No    History reviewed. No pertinent family history.  No Known Allergies  Medication list has been reviewed and updated.  No current outpatient prescriptions on file prior to visit.   Current Facility-Administered Medications on File Prior to Visit  Medication Dose Route Frequency Provider Last Rate Last Dose  . etonogestrel ([name]) implant 68 mg  68 mg Subdermal Once English, Stephanie D, PA        ROS ROS otherwise unremarkable unless listed above.  Physical Examination: BP 111/74   Pulse 81   Temp 98.6 F (37 C) (Oral)   Resp 18   Ht 5' 4.17" (1.63 m)   Wt 134 lb (60.8 kg)   LMP 03/23/2017 (Approximate)   BMI 22.88 kg/m  Ideal Body Weight: Weight in (lb) to have BMI = 25: 146.1  Physical Exam  Constitutional: She is oriented to person, place, and time. She appears well-developed and well-nourished. No distress.  HENT:  Head: Normocephalic and atraumatic.  Right Ear: External ear normal.  Left Ear: External ear normal.  Eyes: Pupils are equal, round, and reactive to light. Conjunctivae and EOM are normal.  Right upper eyelid swollen  at the medial area with lid everted with erythema.  Normal eye movement.    Cardiovascular: Normal rate.   Pulmonary/Chest: Effort normal. No respiratory distress.  Neurological: She is alert and oriented to person, place, and time.  Skin: She is not diaphoretic.  Psychiatric: She has a normal mood and affect. Her behavior is normal.     Assessment and Plan: Nancy Mcconnell is a 21 y.o. female who is here today for cc of right eye swelling. No diagnosis found.  Trena PlattStephanie English, PA-C [facility] 7/12/20186:47 PM  There are no diagnoses linked to this encounter.  Trena PlattStephanie English, PA-C [facility] 03/26/2017 6:39 PM

## 2017-04-10 ENCOUNTER — Ambulatory Visit: Payer: BLUE CROSS/BLUE SHIELD | Admitting: Family Medicine

## 2017-12-15 ENCOUNTER — Encounter: Payer: Self-pay | Admitting: Physician Assistant

## 2018-01-09 ENCOUNTER — Other Ambulatory Visit: Payer: Self-pay

## 2018-01-09 ENCOUNTER — Ambulatory Visit (INDEPENDENT_AMBULATORY_CARE_PROVIDER_SITE_OTHER): Payer: Managed Care, Other (non HMO) | Admitting: Physician Assistant

## 2018-01-09 ENCOUNTER — Encounter: Payer: Self-pay | Admitting: Physician Assistant

## 2018-01-09 VITALS — BP 96/70 | HR 88 | Temp 98.3°F | Resp 12 | Ht 63.66 in | Wt 139.8 lb

## 2018-01-09 DIAGNOSIS — H6121 Impacted cerumen, right ear: Secondary | ICD-10-CM | POA: Diagnosis not present

## 2018-01-09 NOTE — Progress Notes (Signed)
   Nancy Mcconnell  MRN: [id number] DOB: Feb 02, 1996  PCP: Patient, No Pcp Per  Chief Complaint  Patient presents with  . Ear Fullness    x1 week, right ear clogged sensation. Decreased hearing. Hasn't tried anything.     Subjective:  Pt presents to clinic for right ear feeling clogged.  It started last week and she thought it would go away but it has not.  She has used nothing to help.  Hearing is decreased - muffled.  The day before she was outside in the cold.  She do not have any cold symptoms or allergy symptoms.  She has never had this before.  History is obtained by patient.  Review of Systems  Constitutional: Negative for chills and fever.  HENT: Positive for hearing loss (muffled). Negative for congestion, ear discharge and ear pain.   Allergic/Immunologic: Negative for environmental allergies.    There are no active problems to display for this patient.   Current Outpatient Medications on File Prior to Visit  Medication Sig Dispense Refill  . etonogestrel ([name]) 68 MG IMPL implant 1 each by Subdermal route once.     No current facility-administered medications on file prior to visit.     No Known Allergies  History reviewed. No pertinent past medical history. Social History   Social History Narrative  . Not on file   Social History   Tobacco Use  . Smoking status: Never Smoker  . Smokeless tobacco: Never Used  Substance Use Topics  . Alcohol use: Not Currently    Alcohol/week: 0.0 oz  . Drug use: Never   family history is not on file.     Objective:  BP 96/70   Pulse 88   Temp 98.3 F (36.8 C)   Resp 12   Ht 5' 3.66" (1.617 m)   Wt 139 lb 12.8 oz (63.4 kg)   LMP 01/05/2018 (Exact Date)   SpO2 98%   BMI 24.25 kg/m  Body mass index is 24.25 kg/m.  Physical Exam  Constitutional: She is oriented to person, place, and time. She appears well-developed and well-nourished.  HENT:  Head: Normocephalic and atraumatic.  Right Ear: Hearing,  tympanic membrane and external ear normal. A foreign body (cerumen impaction - cleared with lavage by CMA) is present.  Left Ear: Hearing, tympanic membrane, external ear and ear canal normal.  Nose: Nose normal.  Mouth/Throat: Uvula is midline, oropharynx is clear and moist and mucous membranes are normal.  Eyes: Conjunctivae are normal.  Neck: Normal range of motion.  Cardiovascular: Normal rate, regular rhythm and normal heart sounds.  No murmur heard. Pulmonary/Chest: Effort normal and breath sounds normal.  Neurological: She is alert and oriented to person, place, and time.  Skin: Skin is warm and dry.  Psychiatric: Judgment normal.  Vitals reviewed.   Assessment and Plan :  Hearing loss due to cerumen impaction, right - Plan: Ear wax removal removed with lavage  Benny Lennert PA-C  Primary Care at [facility] 01/09/2018 4:08 PM

## 2018-01-09 NOTE — Patient Instructions (Signed)
     IF you received an x-ray today, you will receive an invoice from [facility]. Please contact [facility] at [phone] with questions or concerns regarding your invoice.   IF you received labwork today, you will receive an invoice from [facility]. Please contact [facility] at [phone] with questions or concerns regarding your invoice.   Our billing staff will not be able to assist you with questions regarding bills from these companies.  You will be contacted with the lab results as soon as they are available. The fastest way to get your results is to activate your My Chart account. Instructions are located on the last page of this paperwork. If you have not heard from us regarding the results in 2 weeks, please contact this office.     

## 2018-02-18 ENCOUNTER — Ambulatory Visit: Payer: BLUE CROSS/BLUE SHIELD | Admitting: Physician Assistant

## 2018-03-08 ENCOUNTER — Encounter: Payer: Self-pay | Admitting: Family Medicine

## 2018-03-08 ENCOUNTER — Ambulatory Visit (INDEPENDENT_AMBULATORY_CARE_PROVIDER_SITE_OTHER): Payer: Managed Care, Other (non HMO) | Admitting: Family Medicine

## 2018-03-08 VITALS — BP 103/72 | HR 88 | Ht 64.0 in | Wt 136.4 lb

## 2018-03-08 DIAGNOSIS — Z01419 Encounter for gynecological examination (general) (routine) without abnormal findings: Secondary | ICD-10-CM

## 2018-03-08 DIAGNOSIS — Z124 Encounter for screening for malignant neoplasm of cervix: Secondary | ICD-10-CM | POA: Diagnosis not present

## 2018-03-08 DIAGNOSIS — Z113 Encounter for screening for infections with a predominantly sexual mode of transmission: Secondary | ICD-10-CM

## 2018-03-08 DIAGNOSIS — N923 Ovulation bleeding: Secondary | ICD-10-CM | POA: Diagnosis not present

## 2018-03-08 MED ORDER — MEDROXYPROGESTERONE ACETATE 10 MG PO TABS: 10.0000 mg | ORAL_TABLET | Freq: Every day | ORAL | 3 refills | Status: DC | PRN

## 2018-03-08 NOTE — Patient Instructions (Signed)

## 2018-03-08 NOTE — Progress Notes (Signed)
GYNECOLOGY ANNUAL PREVENTATIVE CARE ENCOUNTER NOTE  Subjective:   Nancy Mcconnell is a 22 y.o. No obstetric history on file. female here for a routine annual gynecologic exam.  Current complaints: irregular bleeding with nexplanon. Had nexplanon inserted 2 years ago.   Denies abnormal vaginal bleeding, discharge, pelvic pain, problems with intercourse or other gynecologic concerns.    Gynecologic History Patient's last menstrual period was 02/22/2018. Patient is sexually active  Contraception: [name] Last Pap: never.  Last mammogram: n/a.   Obstetric History OB History  No data available    No past medical history on file.  No past surgical history on file.  Current Outpatient Medications on File Prior to Visit  Medication Sig Dispense Refill  . erythromycin [facility](ROMYCIN) ophthalmic ointment Place 1 application into the right eye 4 (four) times daily. (Patient not taking: Reported on 03/08/2018) 3.5 g 0   Current Facility-Administered Medications on File Prior to Visit  Medication Dose Route Frequency Provider Last Rate Last Dose  . etonogestrel ([name]) implant 68 mg  68 mg Subdermal Once English, Stephanie D, PA        No Known Allergies  Social History   Socioeconomic History  . Marital status: Single    Spouse name: Not on file  . Number of children: Not on file  . Years of education: Not on file  . Highest education level: Not on file  Occupational History  . Not on file  Social Needs  . Financial resource strain: Not on file  . Food insecurity:    Worry: Not on file    Inability: Not on file  . Transportation needs:    Medical: Not on file    Non-medical: Not on file  Tobacco Use  . Smoking status: Never Smoker  . Smokeless tobacco: Never Used  Substance and Sexual Activity  . Alcohol use: No  . Drug use: No  . Sexual activity: Not on file  Lifestyle  . Physical activity:    Days per week: Not on file    Minutes per session: Not on file  .  Stress: Not on file  Relationships  . Social connections:    Talks on phone: Not on file    Gets together: Not on file    Attends religious service: Not on file    Active member of club or organization: Not on file    Attends meetings of clubs or organizations: Not on file    Relationship status: Not on file  . Intimate partner violence:    Fear of current or ex partner: Not on file    Emotionally abused: Not on file    Physically abused: Not on file    Forced sexual activity: Not on file  Other Topics Concern  . Not on file  Social History Narrative  . Not on file    No family history on file.  The following portions of the patient's history were reviewed and updated as appropriate: allergies, current medications, past family history, past medical history, past social history, past surgical history and problem list.  Review of Systems Pertinent items are noted in HPI.   Objective:  BP 103/72   Pulse 88   Ht 5\' 4"  (1.626 m)   Wt 136 lb 6.4 oz (61.9 kg)   LMP 02/22/2018   BMI 23.41 kg/m  CONSTITUTIONAL: Well-developed, well-nourished female in no acute distress.  HENT:  Normocephalic, atraumatic, External right and left ear normal. Oropharynx is clear and moist EYES:  Conjunctivae and EOM are normal. Pupils are equal, round, and reactive to light. No scleral icterus.  NECK: Normal range of motion, supple, no masses.  Normal thyroid.   CARDIOVASCULAR: Normal heart rate noted, regular rhythm RESPIRATORY: Clear to auscultation bilaterally. Effort and breath sounds normal, no problems with respiration noted. BREASTS: Symmetric in size. No masses, skin changes, nipple drainage, or lymphadenopathy. ABDOMEN: Soft, normal bowel sounds, no distention noted.  No tenderness, rebound or guarding.  PELVIC: Normal appearing external genitalia; normal appearing vaginal mucosa and cervix.  No abnormal discharge noted.  Pap smear obtained.  Normal uterine size, no other palpable masses, no  uterine or adnexal tenderness. MUSCULOSKELETAL: Normal range of motion. No tenderness.  No cyanosis, clubbing, or edema.  2+ distal pulses. SKIN: Skin is warm and dry. No rash noted. Not diaphoretic. No erythema. No pallor. NEUROLOGIC: Alert and oriented to person, place, and time. Normal reflexes, muscle tone coordination. No cranial nerve deficit noted. PSYCHIATRIC: Normal mood and affect. Normal behavior. Normal judgment and thought content.  Assessment:  Annual gynecologic examination with pap smear   Plan:  1. Well Woman Exam Will follow up results of pap smear and manage accordingly. STD testing discussed. Patient requested testing - blood and vaginal - Cytology - PAP - HIV antibody (with reflex) - RPR - Hepatitis C Antibody - Hepatitis B Surface AntiGEN  2. Spotting between menses Secondary to nexplanon. Will give provera to take in addition to nexplanon.  Routine preventative health maintenance measures emphasized. Please refer to After Visit Summary for other counseling recommendations.    Nancy Celeste, DO [facility]

## 2018-03-10 ENCOUNTER — Encounter: Payer: BLUE CROSS/BLUE SHIELD | Admitting: Family Medicine

## 2018-03-10 LAB — CYTOLOGY - PAP
Bacterial vaginitis: NEGATIVE
Candida vaginitis: NEGATIVE
Chlamydia: NEGATIVE
Neisseria Gonorrhea: NEGATIVE
Trichomonas: NEGATIVE

## 2018-03-11 LAB — HEPATITIS C ANTIBODY: Hep C Virus Ab: <0.1 s/co ratio (ref 0.0–0.9)

## 2018-03-11 LAB — HIV ANTIBODY (ROUTINE TESTING W REFLEX): HIV Screen 4th Generation wRfx: NONREACTIVE

## 2018-03-11 LAB — HEPATITIS B SURFACE ANTIGEN: Hepatitis B Surface Ag: NEGATIVE

## 2018-03-11 LAB — RPR: RPR: NONREACTIVE

## 2018-03-12 ENCOUNTER — Telehealth: Payer: Self-pay

## 2018-03-12 ENCOUNTER — Encounter: Payer: Self-pay | Admitting: Family Medicine

## 2018-03-12 DIAGNOSIS — R87612 Low grade squamous intraepithelial lesion on cytologic smear of cervix (LGSIL): Secondary | ICD-10-CM | POA: Insufficient documentation

## 2018-03-12 NOTE — Telephone Encounter (Signed)
-----   Message from Levie HeritageJacob J Stinson, DO sent at 03/12/2018  8:44 AM EDT ----- PAP was LSIL. ASCCP guidelines: Needs to return in 1 year for repeat PAP and cytology.

## 2018-03-12 NOTE — Telephone Encounter (Signed)
Left message for patient to return call to office. Spencer Peterkin  RN 

## 2018-03-19 ENCOUNTER — Ambulatory Visit: Payer: Managed Care, Other (non HMO) | Admitting: Family Medicine

## 2018-03-19 ENCOUNTER — Other Ambulatory Visit: Payer: Self-pay

## 2018-03-26 NOTE — Telephone Encounter (Signed)
Left message for patient to return call to office. Jennifer Howard  RN 

## 2018-03-29 NOTE — Telephone Encounter (Signed)
Patient made aware of abnormal papsmear and need for re pap in one year. Armandina StammerJennifer Christel Bai RN

## 2018-03-31 ENCOUNTER — Other Ambulatory Visit: Payer: Self-pay

## 2018-03-31 ENCOUNTER — Encounter: Payer: Self-pay | Admitting: Physician Assistant

## 2018-03-31 ENCOUNTER — Ambulatory Visit: Payer: Managed Care, Other (non HMO) | Admitting: Physician Assistant

## 2018-03-31 VITALS — BP 110/80 | HR 92 | Temp 99.1°F | Resp 18 | Ht 64.45 in | Wt 133.2 lb

## 2018-03-31 DIAGNOSIS — N760 Acute vaginitis: Secondary | ICD-10-CM

## 2018-03-31 DIAGNOSIS — N898 Other specified noninflammatory disorders of vagina: Secondary | ICD-10-CM

## 2018-03-31 DIAGNOSIS — B9689 Other specified bacterial agents as the cause of diseases classified elsewhere: Secondary | ICD-10-CM

## 2018-03-31 DIAGNOSIS — R82998 Other abnormal findings in urine: Secondary | ICD-10-CM

## 2018-03-31 LAB — POCT URINALYSIS DIP (MANUAL ENTRY)
BILIRUBIN UA: NEGATIVE mg/dL
Bilirubin, UA: NEGATIVE
Glucose, UA: NEGATIVE mg/dL
Nitrite, UA: NEGATIVE
Protein Ur, POC: 100 mg/dL — AB
SPEC GRAV UA: 1.02 (ref 1.010–1.025)
UROBILINOGEN UA: 2 U/dL — AB
pH, UA: 6 (ref 5.0–8.0)

## 2018-03-31 LAB — POCT WET + KOH PREP
TRICH BY WET PREP: ABSENT
YEAST BY KOH: ABSENT
Yeast by wet prep: ABSENT

## 2018-03-31 MED ORDER — METRONIDAZOLE 500 MG PO TABS: 500.0000 mg | ORAL_TABLET | Freq: Two times a day (BID) | ORAL | 0 refills | Status: DC

## 2018-03-31 NOTE — Patient Instructions (Addendum)
Your sample did show clue cells, which is consistent with BV. Please take flagyl as prescribed.  Your urine had some bacteria in it, we have sent that off for culture.  I recommend returning in 2 weeks for repeat urinalysis. Thank you for letting me participate in your health and well being.  Bacterial Vaginosis Bacterial vaginosis is an infection of the vagina. It happens when too many germs (bacteria) grow in the vagina. This infection puts you at risk for infections from sex (STIs). Treating this infection can lower your risk for some STIs. You should also treat this if you are pregnant. It can cause your baby to be born early. Follow these instructions at home: Medicines  Take over-the-counter and prescription medicines only as told by your doctor.  Take or use your antibiotic medicine as told by your doctor. Do not stop taking or using it even if you start to feel better. General instructions  If you your sexual partner is a woman, tell her that you have this infection. She needs to get treatment if she has symptoms. If you have a female partner, he does not need to be treated.  During treatment: ? Avoid sex. ? Do not douche. ? Avoid alcohol as told. ? Avoid breastfeeding as told.  Drink enough fluid to keep your pee (urine) clear or pale yellow.  Keep your vagina and butt (rectum) clean. ? Wash the area with warm water every day. ? Wipe from front to back after you use the toilet.  Keep all follow-up visits as told by your doctor. This is important. Preventing this condition  Do not douche.  Use only warm water to wash around your vagina.  Use protection when you have sex. This includes: ? Latex condoms. ? Dental dams.  Limit how many people you have sex with. It is best to only have sex with the same person (be monogamous).  Get tested for STIs. Have your partner get tested.  Wear underwear that is cotton or lined with cotton.  Avoid tight pants and pantyhose.  This is most important in summer.  Do not use any products that have nicotine or tobacco in them. These include cigarettes and e-cigarettes. If you need help quitting, ask your doctor.  Do not use illegal drugs.  Limit how much alcohol you drink. Contact a doctor if:  Your symptoms do not get better, even after you are treated.  You have more discharge or pain when you pee (urinate).  You have a fever.  You have pain in your belly (abdomen).  You have pain with sex.  Your bleed from your vagina between periods. Summary  This infection happens when too many germs (bacteria) grow in the vagina.  Treating this condition can lower your risk for some infections from sex (STIs).  You should also treat this if you are pregnant. It can cause early (premature) birth.  Do not stop taking or using your antibiotic medicine even if you start to feel better. This information is not intended to replace advice given to you by your health care provider. Make sure you discuss any questions you have with your health care provider. Document Released: 06/10/2008 Document Revised: 05/17/2016 Document Reviewed: 05/17/2016 Elsevier Interactive Patient Education  2017 [employer]    IF you received an x-ray today, you will receive an invoice from [facility]. Please contact [facility] at [phone] with questions or concerns regarding your invoice.   IF you received labwork today, you will receive an  invoice from [facility]. Please contact [facility] at [phone] with questions or concerns regarding your invoice.   Our billing staff will not be able to assist you with questions regarding bills from these companies.  You will be contacted with the lab results as soon as they are available. The fastest way to get your results is to activate your My Chart account. Instructions are located on the last page of this paperwork. If you have not heard from [place] regarding the results in 2  weeks, please contact this office.

## 2018-03-31 NOTE — Progress Notes (Signed)
03/31/2018 at 6:10 PM  Nancy Mcconnell / DOB: [birth year] / MRN: [id number]  The patient has Right knee pain and LGSIL on Pap smear of cervix on their problem list.  SUBJECTIVE  Nancy Mcconnell is a 22 y.o. female who complains of vaginal discharge and vaginal odor x 2 weeks.  She denies vaginal itching, dysuria, hematuria, urinary frequency, urinary urgency, flank pain, abdominal pain and pelvic pain. Has not tried anything for relief.  No recent abx. LMP 03/24/18.Sexually active with monogamous partner. Just had STD testing on 03/08/18 and it was normal.   She  has no past medical history on file.    Medications reviewed and updated by myself where necessary, and exist elsewhere in the encounter.   [name] has No Known Allergies. She  reports that she has never smoked. She has never used smokeless tobacco. She reports that she drinks alcohol. She reports that she does not use drugs. She  reports that she currently engages in sexual activity. She reports using the following method of birth control/protection: Implant. The patient  has no past surgical history on file.  Her family history is not on file.  Review of Systems  Constitutional: Negative for chills and fever.  Gastrointestinal: Negative for nausea and vomiting.    OBJECTIVE  Her  height is 5' 4.45" (1.637 m) and weight is 133 lb 3.2 oz (60.4 kg). Her oral temperature is 99.1 F (37.3 C). Her blood pressure is 110/80 and her pulse is 92. Her respiration is 18 and oxygen saturation is 100%.  The patient's body mass index is 22.55 kg/m.  Physical Exam  Constitutional: She is oriented to person, place, and time. She appears well-developed and well-nourished. No distress.  HENT:  Head: Normocephalic and atraumatic.  Eyes: Conjunctivae are normal.  Neck: Normal range of motion.  Pulmonary/Chest: Effort normal.  Genitourinary: No tenderness in the vagina. Vaginal discharge (moderate amount of creamy yellowish white  discharge) found.  Genitourinary Comments: CMA chaperone present for GU exam  Neurological: She is alert and oriented to person, place, and time.  Skin: Skin is warm and dry.  Psychiatric: She has a normal mood and affect.  Vitals reviewed.   Results for orders placed or performed in visit on 03/31/18 (from the past 24 hour(s))  POCT Wet + KOH Prep     Status: Abnormal   Collection Time: 03/31/18  5:06 PM  Result Value Ref Range   Yeast by KOH Absent Absent   Yeast by wet prep Absent Absent   WBC by wet prep Many (A) Few   Clue Cells Wet Prep HPF POC Few (A) None   Trich by wet prep Absent Absent   Bacteria Wet Prep HPF POC Moderate (A) Few   Epithelial Cells By [employer]et Pref ([facility]) Many (A) None, Few, Too numerous to count   RBC,UR,HPF,POC None None RBC/hpf  POCT urinalysis dipstick     Status: Abnormal   Collection Time: 03/31/18  5:18 PM  Result Value Ref Range   Color, UA yellow yellow   Clarity, UA cloudy (A) clear   Glucose, UA negative negative mg/dL   Bilirubin, UA negative negative   Ketones, POC UA negative negative mg/dL   Spec Grav, UA 4.0981.020 [id number] - 1.025   Blood, UA trace-lysed (A) negative   pH, UA 6.0 5.0 - 8.0   Protein Ur, POC =100 (A) negative mg/dL   Urobilinogen, UA 2.0 (A) 0.2 or 1.0 E.U./dL   Nitrite, UA Negative  Negative   Leukocytes, UA Small (1+) (A) Negative    ASSESSMENT & PLAN  Emer was seen today for vaginal discharge.  Diagnoses and all orders for this visit:  Vaginal discharge -     POCT urinalysis dipstick -     POCT Wet + KOH Prep  Bacterial vaginosis -     metroNIDAZOLE (FLAGYL) 500 MG tablet; Take 1 tablet (500 mg total) by mouth 2 (two) times daily with a meal. DO NOT CONSUME ALCOHOL WHILE TAKING THIS MEDICATION.  Leukocytes in urine -     Urine Culture    Hx and wet prep suggestive of BV. UA with leuks but no urinary sx.  Urine cx pending. The patient was advised to call or come back to clinic if she does not see an improvement  in symptoms, or worsens with the above plan.   Benjiman Core, PA-C  [facility] 03/31/2018 6:10 PM

## 2018-04-01 LAB — URINE CULTURE

## 2018-05-16 DIAGNOSIS — E0500 Thyrotoxicosis with diffuse goiter without thyrotoxic crisis or storm: Secondary | ICD-10-CM

## 2018-05-16 HISTORY — DX: Thyrotoxicosis with diffuse goiter without thyrotoxic crisis or storm: E05.00

## 2018-05-27 ENCOUNTER — Encounter (HOSPITAL_BASED_OUTPATIENT_CLINIC_OR_DEPARTMENT_OTHER): Payer: Self-pay

## 2018-05-27 ENCOUNTER — Observation Stay (HOSPITAL_BASED_OUTPATIENT_CLINIC_OR_DEPARTMENT_OTHER)
Admission: EM | Admit: 2018-05-27 | Discharge: 2018-05-29 | Disposition: A | Discharge status: Home / Self Care | Payer: Self-pay | Attending: Internal Medicine | Admitting: Internal Medicine

## 2018-05-27 ENCOUNTER — Emergency Department (HOSPITAL_BASED_OUTPATIENT_CLINIC_OR_DEPARTMENT_OTHER): Payer: Self-pay

## 2018-05-27 ENCOUNTER — Observation Stay (HOSPITAL_BASED_OUTPATIENT_CLINIC_OR_DEPARTMENT_OTHER): Payer: Self-pay

## 2018-05-27 ENCOUNTER — Other Ambulatory Visit: Payer: Self-pay

## 2018-05-27 DIAGNOSIS — E039 Hypothyroidism, unspecified: Secondary | ICD-10-CM | POA: Insufficient documentation

## 2018-05-27 DIAGNOSIS — R945 Abnormal results of liver function studies: Secondary | ICD-10-CM | POA: Diagnosis present

## 2018-05-27 DIAGNOSIS — B9689 Other specified bacterial agents as the cause of diseases classified elsewhere: Secondary | ICD-10-CM | POA: Insufficient documentation

## 2018-05-27 DIAGNOSIS — E0590 Thyrotoxicosis, unspecified without thyrotoxic crisis or storm: Secondary | ICD-10-CM | POA: Diagnosis present

## 2018-05-27 DIAGNOSIS — R000 Tachycardia, unspecified: Secondary | ICD-10-CM | POA: Diagnosis present

## 2018-05-27 DIAGNOSIS — K769 Liver disease, unspecified: Secondary | ICD-10-CM | POA: Insufficient documentation

## 2018-05-27 DIAGNOSIS — R42 Dizziness and giddiness: Principal | ICD-10-CM | POA: Insufficient documentation

## 2018-05-27 DIAGNOSIS — R188 Other ascites: Secondary | ICD-10-CM | POA: Insufficient documentation

## 2018-05-27 DIAGNOSIS — N390 Urinary tract infection, site not specified: Secondary | ICD-10-CM | POA: Insufficient documentation

## 2018-05-27 HISTORY — DX: Other specified health status: Z78.9

## 2018-05-27 HISTORY — DX: Tachycardia, unspecified: R00.0

## 2018-05-27 LAB — COMPREHENSIVE METABOLIC PANEL
ALT: 89 U/L — ABNORMAL HIGH (ref 0–44)
ANION GAP: 8 (ref 5–15)
AST: 59 U/L — ABNORMAL HIGH (ref 15–41)
Albumin: 3.6 g/dL (ref 3.5–5.0)
Alkaline Phosphatase: 61 U/L (ref 38–126)
BUN: 10 mg/dL (ref 6–20)
CO2: 25 mmol/L (ref 22–32)
Calcium: 9.4 mg/dL (ref 8.9–10.3)
Chloride: 105 mmol/L (ref 98–111)
Creatinine, Ser: 0.37 mg/dL — ABNORMAL LOW (ref 0.44–1.00)
Glucose, Bld: 112 mg/dL — ABNORMAL HIGH (ref 70–99)
POTASSIUM: 3.7 mmol/L (ref 3.5–5.1)
SODIUM: 138 mmol/L (ref 135–145)
Total Bilirubin: 0.5 mg/dL (ref 0.3–1.2)
Total Protein: 6.5 g/dL (ref 6.5–8.1)

## 2018-05-27 LAB — RAPID URINE DRUG SCREEN, HOSP PERFORMED
AMPHETAMINES: NOT DETECTED
BARBITURATES: NOT DETECTED
BENZODIAZEPINES: NOT DETECTED
COCAINE: NOT DETECTED
Opiates: NOT DETECTED
TETRAHYDROCANNABINOL: NOT DETECTED

## 2018-05-27 LAB — TROPONIN I: Troponin I: <0.03 ng/mL (ref <0.03)

## 2018-05-27 LAB — URINALYSIS, ROUTINE W REFLEX MICROSCOPIC
Bilirubin Urine: NEGATIVE
GLUCOSE, UA: NEGATIVE mg/dL
Ketones, ur: NEGATIVE mg/dL
LEUKOCYTES UA: NEGATIVE
Nitrite: NEGATIVE
Protein, ur: >300 mg/dL — AB
SPECIFIC GRAVITY, URINE: 1.025 (ref 1.005–1.030)
pH: 6 (ref 5.0–8.0)

## 2018-05-27 LAB — CBC
HEMATOCRIT: 38.9 % (ref 36.0–46.0)
HEMOGLOBIN: 13.6 g/dL (ref 12.0–15.0)
MCH: 26.5 pg (ref 26.0–34.0)
MCHC: 35 g/dL (ref 30.0–36.0)
MCV: 75.7 fL — AB (ref 78.0–100.0)
PLATELETS: 319 10*3/uL (ref 150–400)
RBC: 5.14 MIL/uL — AB (ref 3.87–5.11)
RDW: 13.1 % (ref 11.5–15.5)
WBC: 5.5 10*3/uL (ref 4.0–10.5)

## 2018-05-27 LAB — URINALYSIS, MICROSCOPIC (REFLEX)

## 2018-05-27 LAB — PREGNANCY, URINE: PREG TEST UR: NEGATIVE

## 2018-05-27 LAB — TSH: TSH: <0.01 u[IU]/mL — ABNORMAL LOW (ref 0.350–4.500)

## 2018-05-27 LAB — CBG MONITORING, ED: Glucose-Capillary: 105 mg/dL — ABNORMAL HIGH (ref 70–99)

## 2018-05-27 LAB — LIPASE, BLOOD: Lipase: 36 U/L (ref 11–51)

## 2018-05-27 LAB — D-DIMER, QUANTITATIVE: D-Dimer, Quant: 0.39 ug/mL-FEU (ref 0.00–0.50)

## 2018-05-27 MED ORDER — ENOXAPARIN SODIUM 40 MG/0.4ML ~~LOC~~ SOLN
40.0000 mg | SUBCUTANEOUS | Status: DC
  Filled 2018-05-27 (×2): qty 0.4

## 2018-05-27 MED ORDER — ATENOLOL 25 MG PO TABS
25.0000 mg | ORAL_TABLET | Freq: Every day | ORAL | Status: DC
  Administered 2018-05-27 – 2018-05-29 (×3): 25 mg via ORAL
  Filled 2018-05-27 (×3): qty 1

## 2018-05-27 MED ORDER — SODIUM CHLORIDE 0.9 % IV SOLN: 250.0000 mL | INTRAVENOUS | Status: DC | PRN

## 2018-05-27 MED ORDER — ACETAMINOPHEN 650 MG RE SUPP: 650.0000 mg | Freq: Four times a day (QID) | RECTAL | Status: DC | PRN

## 2018-05-27 MED ORDER — ACETAMINOPHEN 325 MG PO TABS: 650.0000 mg | ORAL_TABLET | Freq: Four times a day (QID) | ORAL | Status: DC | PRN

## 2018-05-27 MED ORDER — SODIUM CHLORIDE 0.9% FLUSH
3.0000 mL | Freq: Two times a day (BID) | INTRAVENOUS | Status: DC
  Administered 2018-05-27 – 2018-05-29 (×4): 3 mL via INTRAVENOUS

## 2018-05-27 MED ORDER — SODIUM CHLORIDE 0.9% FLUSH: 3.0000 mL | INTRAVENOUS | Status: DC | PRN

## 2018-05-27 MED ORDER — SODIUM CHLORIDE 0.9 % IV BOLUS
1000.0000 mL | Freq: Once | INTRAVENOUS | Status: AC
  Administered 2018-05-27: 1000 mL via INTRAVENOUS

## 2018-05-27 NOTE — ED Notes (Signed)
ED Provider at bedside. 

## 2018-05-27 NOTE — ED Notes (Signed)
[facility] notified (Nancy Mcconnell) - patient ready for transport 

## 2018-05-27 NOTE — Progress Notes (Signed)
22 yo female with dizziness, and n/v x3 weeks.  Pt is tachycardic, at 120 per ED,   EKG ST at 150 pr 118  ER concerned regarding her tachycardia,  ? SVT , hyperthyroidism

## 2018-05-27 NOTE — ED Notes (Signed)
Patient transported to Ultrasound 

## 2018-05-27 NOTE — ED Triage Notes (Signed)
Pt presents with HA, dizziness-feeling like she is going to faint with N/V. Symptoms x 3 weeks.

## 2018-05-27 NOTE — ED Notes (Signed)
Report to Skeet SimmerFrancisca, Charity fundraiserN on [address] [facility]

## 2018-05-27 NOTE — ED Provider Notes (Signed)
[facility] EMERGENCY DEPARTMENT Provider Note   CSN: [id number] Arrival date & time: 05/27/18  1650     History   Chief Complaint Chief Complaint  Patient presents with  . Dizziness    HPI Nancy Mcconnell is a 22 y.o. female.  22 yo F with a chief complaints of lightheadedness and weakness.  This been going on for the past couple weeks.  Whenever she stands up she feels like she may pass out.  She felt that her heart is been racing.  She denies any new stimulants, denies change in diet or diet supplements.  She denies illegal drug use.  She denies chest pain but has had some shortness of breath when she tries to go upstairs.  She denies lower extremity edema.  Denies cough or congestion.  She has been getting headaches off and on with this.  Sometimes it has made her vomit.  No headache for the past couple days.  She feels that she is been having trouble keeping some food down.  Think she is lost about 15 pounds in the past couple weeks.  Denies likelihood of being pregnant.  The history is provided by the patient and a parent.  Illness  This is a new problem. The current episode started more than 1 week ago. The problem occurs constantly. The problem has been rapidly worsening. Associated symptoms include headaches and shortness of breath. Pertinent negatives include no chest pain. Nothing aggravates the symptoms. Nothing relieves the symptoms. She has tried nothing for the symptoms. The treatment provided no relief.    Past Medical History:  Diagnosis Date  . Medical history non-contributory   . Tachycardia 05/27/2018    Patient Active Problem List   Diagnosis Date Noted  . Tachycardia 05/27/2018  . Hyperthyroidism 05/27/2018  . Abnormal liver function 05/27/2018  . 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 Right knee pain 02/02/2016    Past Surgical History:  Procedure Laterality Date  . NO PAST SURGERIES       OB History   None      Home  Medications    Prior to Admission medications   Medication Sig Start Date End Date Taking? Authorizing Provider  metroNIDAZOLE (FLAGYL) 500 MG tablet Take 1 tablet (500 mg total) by mouth 2 (two) times daily with a meal. DO NOT CONSUME ALCOHOL WHILE TAKING THIS MEDICATION. 03/31/18  Yes Magdalene River, PA-C    Family History History reviewed. No pertinent family history.  Social History Social History   Tobacco Use  . Smoking status: Never Smoker  . Smokeless tobacco: Never Used  Substance Use Topics  . Alcohol use: Yes    Comment: occ  . Drug use: No     Allergies   Patient has no known allergies.   Review of Systems Review of Systems  Constitutional: Negative for chills and fever.  HENT: Negative for congestion and rhinorrhea.   Eyes: Negative for redness and visual disturbance.  Respiratory: Positive for shortness of breath. Negative for wheezing.   Cardiovascular: Negative for chest pain and palpitations.  Gastrointestinal: Positive for nausea and vomiting.  Genitourinary: Negative for dysuria and urgency.  Musculoskeletal: Negative for arthralgias and myalgias.  Skin: Negative for pallor and wound.  Neurological: Positive for syncope, weakness and headaches. Negative for dizziness.     Physical Exam Updated Vital Signs BP 111/61   Pulse (!) 161   Temp 98.5 F (36.9 C) (Oral)   Resp (!) 22  Ht 5\' 2"  (1.575 m)   Wt 54.4 kg   LMP 04/19/2018   SpO2 99%   BMI 21.95 kg/m   Physical Exam  Constitutional: She is oriented to person, place, and time. She appears well-developed and well-nourished. No distress.  HENT:  Head: Normocephalic and atraumatic.  No noted thyroid swelling or pain  Eyes: Pupils are equal, round, and reactive to light. EOM are normal.  Mild proptosis  Neck: Normal range of motion. Neck supple.  Cardiovascular: Regular rhythm. Tachycardia present. Exam reveals no gallop and no friction rub.  No murmur heard. Pulmonary/Chest:  Effort normal. She has no wheezes. She has no rales.  Abdominal: Soft. She exhibits no distension. There is no tenderness.  Musculoskeletal: She exhibits no edema or tenderness.  Neurological: She is alert and oriented to person, place, and time.  Skin: Skin is warm and dry. She is not diaphoretic.  Psychiatric: She has a normal mood and affect. Her behavior is normal.  Nursing note and vitals reviewed.    ED Treatments / Results  Labs (all labs ordered are listed, but only abnormal results are displayed) Labs Reviewed  CBC - Abnormal; Notable for the following components:      Result Value   RBC 5.14 (*)    MCV 75.7 (*)    All other components within normal limits  URINALYSIS, ROUTINE W REFLEX MICROSCOPIC - Abnormal; Notable for the following components:   Hgb urine dipstick MODERATE (*)    Protein, ur >300 (*)    All other components within normal limits  URINALYSIS, MICROSCOPIC (REFLEX) - Abnormal; Notable for the following components:   Bacteria, UA MANY (*)    All other components within normal limits  TSH - Abnormal; Notable for the following components:   TSH <0.010 (*)    All other components within normal limits  COMPREHENSIVE METABOLIC PANEL - Abnormal; Notable for the following components:   Glucose, Bld 112 (*)    Creatinine, Ser 0.37 (*)    AST 59 (*)    ALT 89 (*)    All other components within normal limits  CBG MONITORING, ED - Abnormal; Notable for the following components:   Glucose-Capillary 105 (*)    All other components within normal limits  PREGNANCY, URINE  TROPONIN I  D-DIMER, QUANTITATIVE (NOT [facility])  RAPID URINE DRUG SCREEN, HOSP PERFORMED  LIPASE, BLOOD  T4  THYROTROPIN RECEPTOR AUTOABS  COMPREHENSIVE METABOLIC PANEL  CBC  HEPATITIS PANEL, ACUTE    EKG EKG Interpretation  Date/Time:  Thursday May 27 2018 17:00:44 EDT Ventricular Rate:  156 PR Interval:  118 QRS Duration: 72 QT Interval:  310 QTC Calculation: 499 R  Axis:   72 Text Interpretation:  Sinus tachycardia T wave abnormality, consider inferior ischemia Abnormal ECG No old tracing to compare Confirmed by Melene PlanFloyd, Bellamie Turney [phone]([id number]) on 05/27/2018 5:29:07 PM   Radiology Dg Chest 2 View  Result Date: 05/27/2018 CLINICAL DATA:  Short of breath EXAM: CHEST - 2 VIEW COMPARISON:  None. FINDINGS: The heart size and mediastinal contours are within normal limits. Both lungs are clear. The visualized skeletal structures are unremarkable. IMPRESSION: No active cardiopulmonary disease. Electronically Signed   By: Jasmine PangKim  Fujinaga M.D.   On: 05/27/2018 18:57   Ct Head Wo Contrast  Result Date: 05/27/2018 CLINICAL DATA:  Headache, dizziness, near syncope EXAM: CT HEAD WITHOUT CONTRAST TECHNIQUE: Contiguous axial images were obtained from the base of the skull through the vertex without intravenous contrast. COMPARISON:  None. FINDINGS: Brain:  No evidence of acute infarction, hemorrhage, hydrocephalus, extra-axial collection or mass lesion/mass effect. Vascular: No hyperdense vessel or unexpected calcification. Skull: Intact Sinuses/Orbits: Mastoid air cells are clear. Visualized paranasal sinuses are clear. Other: Prominent adenoidal lymphoid tissue. IMPRESSION: No acute intracranial pathology. Electronically Signed   By: Jolaine Click M.D.   On: 05/27/2018 19:08   US Abdomen Limited Ruq  Result Date: 05/27/2018 CLINICAL DATA:  Tachycardia for 2 weeks, elevated liver function test. EXAM: ULTRASOUND ABDOMEN LIMITED RIGHT UPPER QUADRANT COMPARISON:  None. FINDINGS: Gallbladder: No gallstones or wall thickening visualized. No sonographic Murphy sign noted by sonographer. Common bile duct: Diameter: 1 mm Liver: No focal lesion identified. Within normal limits in parenchymal echogenicity. Portal triads are echogenic. Portal vein is patent on color Doppler imaging with normal direction of blood flow towards the liver. Trace ascites at Morison's pouch. IMPRESSION: 1. Echogenic portal  triads (starry sky appearance) throughout the liver, nonspecific but can be associated with hepatitis. Liver appears otherwise unremarkable. 2. Trace ascites in Morrison's pouch. 3. Gallbladder appears normal. No gallstones. No evidence of cholecystitis. Electronically Signed   By: Bary Richard M.D.   On: 05/27/2018 20:59    Procedures Procedures (including critical care time)  Medications Ordered in ED Medications  atenolol (TENORMIN) tablet 25 mg (25 mg Oral Given 05/27/18 2232)  enoxaparin (LOVENOX) injection 40 mg (40 mg Subcutaneous Not Given 05/27/18 2241)  sodium chloride flush (NS) 0.9 % injection 3 mL (3 mLs Intravenous Given 05/27/18 2243)  sodium chloride flush (NS) 0.9 % injection 3 mL (has no administration in time range)  0.9 %  sodium chloride infusion (has no administration in time range)  acetaminophen (TYLENOL) tablet 650 mg (has no administration in time range)    Or  acetaminophen (TYLENOL) suppository 650 mg (has no administration in time range)  sodium chloride 0.9 % bolus 1,000 mL ( Intravenous Stopped 05/27/18 1758)  sodium chloride 0.9 % bolus 1,000 mL ( Intravenous Stopped 05/27/18 2030)     Initial Impression / Assessment and Plan / ED Course  I have reviewed the triage vital signs and the nursing notes.  Pertinent labs & imaging results that were available during my care of the patient were reviewed by me and considered in my medical decision making (see chart for details).     22 yo F with a chief complaint of lightheaded numbness and feeling like she may pass out upon standing.  Is been going on for the past couple weeks.  Patient is also lost 15 pounds in that timeframe.  She has had a tremor and is felt a bit jittery.  She denies any increased caffeine use denies illegal drug use.  She denies fevers or chills.  Denies cough or congestion.  She has had episodic headaches.  None for the past couple days.  She has had some nausea and vomiting with the headaches.   She denies likelihood of being pregnant.  On my exam patient's initial heart rate was in the 160s.  She has clear lung sounds.  No signs of heart failure.  She is alert and oriented.  I have ordered a TSH and a T4, unfortunately they are not run at this facility and it has to be transported to another hospital to be run.  At this point that would be my leading diagnosis.  She is persistently tachycardic here she has a negative d-dimer.  Chest x-ray is negative.  CT of the head viewed by me without intracranial hemorrhage.  Troponin  is normal.  EKG is sinus tachycardia.  She was given a liter of fluids with some improvement from the 140s into the 120s.  We will give a second liter.  I discussed the case with the hospitalist who will admit.  I do not start her on a blockers or treatment for hyperthyroidism as this had not yet been fully diagnosed.  CRITICAL CARE Performed by: Rae Roam   Total critical care time: 80 minutes  Critical care time was exclusive of separately billable procedures and treating other patients.  Critical care was necessary to treat or prevent imminent or life-threatening deterioration.  Critical care was time spent personally by me on the following activities: development of treatment plan with patient and/or surrogate as well as nursing, discussions with consultants, evaluation of patient's response to treatment, examination of patient, obtaining history from patient or surrogate, ordering and performing treatments and interventions, ordering and review of laboratory studies, ordering and review of radiographic studies, pulse oximetry and re-evaluation of patient's condition.  The patients results and plan were reviewed and discussed.   Any x-rays performed were independently reviewed by myself.   Differential diagnosis were considered with the presenting HPI.  Medications  atenolol (TENORMIN) tablet 25 mg (25 mg Oral Given 05/27/18 2232)  enoxaparin  (LOVENOX) injection 40 mg (40 mg Subcutaneous Not Given 05/27/18 2241)  sodium chloride flush (NS) 0.9 % injection 3 mL (3 mLs Intravenous Given 05/27/18 2243)  sodium chloride flush (NS) 0.9 % injection 3 mL (has no administration in time range)  0.9 %  sodium chloride infusion (has no administration in time range)  acetaminophen (TYLENOL) tablet 650 mg (has no administration in time range)    Or  acetaminophen (TYLENOL) suppository 650 mg (has no administration in time range)  sodium chloride 0.9 % bolus 1,000 mL ( Intravenous Stopped 05/27/18 1758)  sodium chloride 0.9 % bolus 1,000 mL ( Intravenous Stopped 05/27/18 2030)    Vitals:   05/27/18 1930 05/27/18 2000 05/27/18 2151 05/27/18 2232  BP: 120/74 120/71 111/61   Pulse: (!) 127 (!) 129 (!) 138 (!) 161  Resp: 14 (!) 21 (!) 22   Temp:   98.5 F (36.9 C)   TempSrc:   Oral   SpO2: 100% 100% 99%   Weight:      Height:        Final diagnoses:  Hyperthyroidism  Sinus tachycardia    Admission/ observation were discussed with the admitting physician, patient and/or family and they are comfortable with the plan.   Final Clinical Impressions(s) / ED Diagnoses   Final diagnoses:  Hyperthyroidism  Sinus tachycardia    ED Discharge Orders    None       Melene Plan, DO 05/27/18 2339

## 2018-05-27 NOTE — H&P (Signed)
TRH H&P   Patient Demographics:    Nancy Mcconnell, is a 22 y.o. female  MRN: [id number]   DOB - 20-Jul-1996  Admit Date - 05/27/2018  Outpatient Primary MD for the patient is Shawnee Knapp, MD  Referring MD/NP/PA: Deno Etienne  Outpatient Specialists:    Patient coming from: home  Chief Complaint  Patient presents with  . Dizziness      HPI:    Nancy Mcconnell  is a 22 y.o. female, w c/o dizziness and nausea vomitting for the past 3 weeks.  Pt presented to ED for evaluation.   In Ed,  T 98.5, P 161  RR 22, Bp 11/61  Pox 99% on RA  CT brain IMPRESSION: No acute intracranial pathology.  CXR IMPRESSION: No active cardiopulmonary disease.  RUQ ultrasound IMPRESSION: 1. 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 Liver appears otherwise unremarkable. 2. Trace ascites in Morrison's pouch. 3. Gallbladder appears normal. No gallstones. No evidence of cholecystitis.   Wbc 5.5, Hgb 13.6, Plt 319 Na 138, K 3.7, Bun 10, Creatinine 0.37  Ast 59, Alt 89, Alk phos 61, T. Bili 0.5  Urinalysis wbc 6-10, rbc 6-10 UDS negative  Trop <0.03 D dimer negative  TSH <0.010, Free T4 pending  CBG 105  Pt will be admitted for w/up of tachycardia, secondary to hyperpararthyroidism as well as abnormal liver function and UTI    Review of systems:    In addition to the HPI above,  No Fever-chills, No Headache, No changes with Vision or hearing, No problems swallowing food or Liquids, No Chest pain, Cough or Shortness of Breath, No Abdominal pain,  Bowel movements are regular, No Blood in stool or Urine, No dysuria, No new skin rashes or bruises, No new joints pains-aches,  No new weakness, tingling, numbness in any extremity, No recent weight gain or loss, No polyuria, polydypsia or polyphagia, No  significant Mental Stressors.  A full 10 point Review of Systems was done, except as stated above, all other Review of Systems were negative.   With Past History of the following :    Past Medical History:  Diagnosis Date  . Medical history non-contributory   . Tachycardia 05/27/2018      Past Surgical History:  Procedure Laterality Date  . NO PAST SURGERIES        Social History:     Social History   Tobacco Use  . Smoking status: Never Smoker  . Smokeless tobacco: Never Used  Substance Use Topics  . Alcohol use: Yes    Comment: occ     Lives - at home   Mobility - walks by self    Family History :     Family History  Problem Relation Age of Onset  . Heart failure Father        Home Medications:  Prior to Admission medications   Medication Sig Start Date End Date Taking? Authorizing Provider  metroNIDAZOLE (FLAGYL) 500 MG tablet Take 1 tablet (500 mg total) by mouth 2 (two) times daily with a meal. DO NOT CONSUME ALCOHOL WHILE TAKING THIS MEDICATION. 03/31/18  Yes Tenna Delaine D, PA-C     Allergies:    No Known Allergies   Physical Exam:   Vitals  Blood pressure 111/61, pulse (!) 161, temperature 98.5 F (36.9 C), temperature source Oral, resp. rate (!) 22, height 5' 2" (1.575 m), weight 54.4 kg, last menstrual period 04/19/2018, SpO2 99 %.   1. General  lying in bed in nad,    2. Normal affect and insight, Not Suicidal or Homicidal, Awake Alert, Oriented X 3.  3. No F.N deficits, ALL C.Nerves Intact, Strength 5/5 all 4 extremities, Sensation intact all 4 extremities, Plantars down going.  4. Ears and Eyes appear Normal, Conjunctivae clear, PERRLA. Moist Oral Mucosa.  5. Supple Neck, No JVD, No cervical lymphadenopathy appriciated, No Carotid Bruits.  6. Symmetrical Chest wall movement, Good air movement bilaterally, CTAB.  7. Tachy s1 s2  8. Positive Bowel Sounds, Abdomen Soft, No tenderness, No organomegaly appriciated,No rebound  -guarding or rigidity.  9.  No Cyanosis, Normal Skin Turgor, No Skin Rash or Bruise.  10. Good muscle tone,  joints appear normal , no effusions, Normal ROM.  11. No Palpable Lymph Nodes in Neck or Axillae     Data Review:    CBC Recent Labs  Lab 05/27/18 1708  WBC 5.5  HGB 13.6  HCT 38.9  PLT 319  MCV 75.7*  MCH 26.5  MCHC 35.0  RDW 13.1   ------------------------------------------------------------------------------------------------------------------  Chemistries  Recent Labs  Lab 05/27/18 1801  NA 138  K 3.7  CL 105  CO2 25  GLUCOSE 112*  BUN 10  CREATININE 0.37*  CALCIUM 9.4  AST 59*  ALT 89*  ALKPHOS 61  BILITOT 0.5   ------------------------------------------------------------------------------------------------------------------ estimated creatinine clearance is 87.2 mL/min (A) (by C-G formula based on SCr of 0.37 mg/dL (L)). ------------------------------------------------------------------------------------------------------------------ Recent Labs    05/27/18 1801  TSH <0.010*    Coagulation profile No results for input(s): INR, PROTIME in the last 168 hours. ------------------------------------------------------------------------------------------------------------------- Recent Labs    05/27/18 1801  DDIMER 0.39   -------------------------------------------------------------------------------------------------------------------  Cardiac Enzymes Recent Labs  Lab 05/27/18 1708  TROPONINI <0.03   ------------------------------------------------------------------------------------------------------------------ No results found for: BNP   ---------------------------------------------------------------------------------------------------------------  Urinalysis    Component Value Date/Time   COLORURINE YELLOW 05/27/2018 1708   APPEARANCEUR CLEAR 05/27/2018 1708   LABSPEC 1.025 05/27/2018 1708   PHURINE 6.0 05/27/2018 1708    GLUCOSEU NEGATIVE 05/27/2018 1708   HGBUR MODERATE (A) 05/27/2018 1708   BILIRUBINUR NEGATIVE 05/27/2018 1708   BILIRUBINUR negative 03/31/2018 1718   BILIRUBINUR neg 03/15/2013 1600   KETONESUR NEGATIVE 05/27/2018 1708   PROTEINUR >300 (A) 05/27/2018 1708   UROBILINOGEN 2.0 (A) 03/31/2018 1718   NITRITE NEGATIVE 05/27/2018 1708   LEUKOCYTESUR NEGATIVE 05/27/2018 1708    ----------------------------------------------------------------------------------------------------------------   Imaging Results:    Dg Chest 2 View  Result Date: 05/27/2018 CLINICAL DATA:  Short of breath EXAM: CHEST - 2 VIEW COMPARISON:  None. FINDINGS: The heart size and mediastinal contours are within normal limits. Both lungs are clear. The visualized skeletal structures are unremarkable. IMPRESSION: No active cardiopulmonary disease. Electronically Signed   By: Donavan Foil M.D.   On: 05/27/2018 18:57   Ct Head Wo Contrast  Result Date: 05/27/2018 CLINICAL DATA:  Headache, dizziness, near syncope EXAM: CT HEAD WITHOUT CONTRAST TECHNIQUE: Contiguous axial images were obtained from the base of the skull through the vertex without intravenous contrast. COMPARISON:  None. FINDINGS: Brain: No evidence of acute infarction, hemorrhage, hydrocephalus, extra-axial collection or mass lesion/mass effect. Vascular: No hyperdense vessel or unexpected calcification. Skull: Intact Sinuses/Orbits: Mastoid air cells are clear. Visualized paranasal sinuses are clear. Other: Prominent adenoidal lymphoid tissue. IMPRESSION: No acute intracranial pathology. Electronically Signed   By: Marybelle Killings M.D.   On: 05/27/2018 19:08   US Abdomen Limited Ruq  Result Date: 05/27/2018 CLINICAL DATA:  Tachycardia for 2 weeks, elevated liver function test. EXAM: ULTRASOUND ABDOMEN LIMITED RIGHT UPPER QUADRANT COMPARISON:  None. FINDINGS: Gallbladder: No gallstones or wall thickening visualized. No sonographic Murphy sign noted by sonographer.  Common bile duct: Diameter: 1 mm Liver: No focal lesion identified. Within normal limits in parenchymal echogenicity. Portal triads are echogenic. Portal vein is patent on color Doppler imaging with normal direction of blood flow towards the liver. Trace ascites at Morison's pouch. IMPRESSION: 1. 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 Liver appears otherwise unremarkable. 2. Trace ascites in Morrison's pouch. 3. Gallbladder appears normal. No gallstones. No evidence of cholecystitis. Electronically Signed   By: Franki Cabot M.D.   On: 05/27/2018 20:59       Assessment & Plan:    Principal Problem:   Tachycardia Active Problems:   Hyperthyroidism   Abnormal liver function    Tachycardia Tele Trop iq6h x3 Check cardiac echo Start atenolol 32m po qday  Hyperthyroidism Check free t4, thyrotropin receptor ab If heart rate not improving may need to start oral agent Please make appointment or consult w endocrinology   Abnormal liver function Check acute hepatitis panel  UTI Await urine culture Start Rocephin 1gm iv qday  DVT Prophylaxis    Lovenox - SCDs   AM Labs Ordered, also please review Full Orders  Family Communication: Admission, patients condition and plan of care including tests being ordered have been discussed with the patient  who indicate understanding and agree with the plan and Code Status.  Code Status  FULL CODE  Likely DC to   Home tomorrow if hr improving with atenolol  Condition GUARDED   Consults called: none  Admission status: observation   Time spent in minutes : 759  JJani GravelM.D on 05/27/2018 at 11:58 PM  Between 7am to 7pm - Pager - [phone] . After 7pm go to [email or web address] - password [email or web address] - Office  [phone]

## 2018-05-28 ENCOUNTER — Observation Stay (HOSPITAL_BASED_OUTPATIENT_CLINIC_OR_DEPARTMENT_OTHER): Payer: Self-pay

## 2018-05-28 DIAGNOSIS — R945 Abnormal results of liver function studies: Secondary | ICD-10-CM

## 2018-05-28 DIAGNOSIS — R000 Tachycardia, unspecified: Secondary | ICD-10-CM

## 2018-05-28 DIAGNOSIS — R8271 Bacteriuria: Secondary | ICD-10-CM

## 2018-05-28 DIAGNOSIS — E0590 Thyrotoxicosis, unspecified without thyrotoxic crisis or storm: Secondary | ICD-10-CM

## 2018-05-28 LAB — CBC
HCT: 31.9 % — ABNORMAL LOW (ref 36.0–46.0)
HEMOGLOBIN: 10.5 g/dL — AB (ref 12.0–15.0)
MCH: 26.1 pg (ref 26.0–34.0)
MCHC: 32.9 g/dL (ref 30.0–36.0)
MCV: 79.2 fL (ref 78.0–100.0)
PLATELETS: 230 10*3/uL (ref 150–400)
RBC: 4.03 MIL/uL (ref 3.87–5.11)
RDW: 12.7 % (ref 11.5–15.5)
WBC: 5.4 10*3/uL (ref 4.0–10.5)

## 2018-05-28 LAB — COMPREHENSIVE METABOLIC PANEL
ALT: 81 U/L — ABNORMAL HIGH (ref 0–44)
ANION GAP: 7 (ref 5–15)
AST: 52 U/L — ABNORMAL HIGH (ref 15–41)
Albumin: 3.2 g/dL — ABNORMAL LOW (ref 3.5–5.0)
Alkaline Phosphatase: 55 U/L (ref 38–126)
BUN: 7 mg/dL (ref 6–20)
CALCIUM: 9.5 mg/dL (ref 8.9–10.3)
CHLORIDE: 108 mmol/L (ref 98–111)
CO2: 24 mmol/L (ref 22–32)
Creatinine, Ser: 0.34 mg/dL — ABNORMAL LOW (ref 0.44–1.00)
GFR calc Af Amer: >60 mL/min (ref >60)
Glucose, Bld: 101 mg/dL — ABNORMAL HIGH (ref 70–99)
Potassium: 3.6 mmol/L (ref 3.5–5.1)
SODIUM: 139 mmol/L (ref 135–145)
Total Bilirubin: 0.7 mg/dL (ref 0.3–1.2)
Total Protein: 5.8 g/dL — ABNORMAL LOW (ref 6.5–8.1)

## 2018-05-28 LAB — ECHOCARDIOGRAM COMPLETE
Height: 62 in
WEIGHTICAEL: 1998.4 [oz_av]

## 2018-05-28 LAB — D-DIMER, QUANTITATIVE (NOT AT ARMC): D-Dimer, Quant: 0.37 ug/mL-FEU (ref 0.00–0.50)

## 2018-05-28 LAB — T4, FREE: Free T4: 5.1 ng/dL — ABNORMAL HIGH (ref 0.82–1.77)

## 2018-05-28 MED ORDER — METHIMAZOLE 5 MG PO TABS
15.0000 mg | ORAL_TABLET | Freq: Every day | ORAL | Status: DC
  Administered 2018-05-28 – 2018-05-29 (×2): 15 mg via ORAL
  Filled 2018-05-28 (×3): qty 1

## 2018-05-28 MED ORDER — SODIUM CHLORIDE 0.9 % IV SOLN
1.0000 g | INTRAVENOUS | Status: DC
  Administered 2018-05-28 (×2): 1 g via INTRAVENOUS
  Filled 2018-05-28 (×3): qty 10

## 2018-05-28 NOTE — Care Management Note (Signed)
Case Management Note  Patient Details  Name: Emmit PomfretCecilia D Glasby MRN: [id number] Date of Birth: [birth year]  Subjective/Objective:      Tachycardia            Action/Plan: Patient lives at home, independent; PCP Sherren MochaShaw, Eva N, MD; no medical insurance; Financial Counselor to screen patient to determine what she might qualify for. CM will continue to follow pt for progression of care  Expected Discharge Date:    possibly 05/30/2018              Expected Discharge Plan:   Hom   Status of Service:   In progress  Reola MosherChandler, Holten Spano L, RN,MHA,BSN [phone] 05/28/2018, 4:02 PM

## 2018-05-28 NOTE — Progress Notes (Signed)
PROGRESS NOTE    Nancy Mcconnell  BJY:[id number] DOB: 12/20/1995 DOA: 05/27/2018 PCP: Sherren Mocha, MD  Outpatient Specialists:     Brief Narrative:  Patient is a 22 year old African-American female with no significant past medical history.  Patient was admitted with generalized malaise, weakness, fatigue and significant sinus tachycardia.  Work-up revealed very low TSH and elevated free T4.  Patient also has exophthalmos.  Likely, patient has hypothyroidism.  Will start patient on methimazole.  Will monitor CBC in the morning.  Patient will follow-up with primary care provider and endocrinologist for further care and discharge.  Bacteriuria was also noted on admission, but the urine culture is still pending.  Patient is on IV antibiotics.  Tachycardia is improving with beta-blockers.  Likely, patient be discharged in the morning.  Patient may be financially challenged, as patient is self-pay.  Case management will kindly assist with discharge needs.  Abnormal liver finding on ultrasound is also noted.  Continue to monitor LFTs.   Assessment & Plan:   Principal Problem:   Tachycardia Active Problems:   Hyperthyroidism   Abnormal liver function   Hypothyroidism: Start methimazole Continue beta-blocker Follow with PCP and endocrinologist on discharge  Bacteriuria: Follow urine culture Complete course of antibiotics in a.m.  Sinus tachycardia: D-dimer is within normal range Likely secondary to hyperthyroidism  Abnormal LFTs: Continue to monitor   DVT prophylaxis: Subcu Lovenox  Code Status: Full Family Communication: Moderate Disposition Plan: Home in the morning   Consultants:   None  Procedures:   None  Antimicrobials:   IV Rocephin   Subjective: No new complaints Reports some improvement Tachycardia is improving No fever or chills  Objective: Vitals:   05/27/18 2232 05/28/18 0248 05/28/18 0619 05/28/18 1203  BP:  118/66 126/70 105/60  Pulse: (!)  161 (!) 113 (!) 122 (!) 122  Resp:  18 18 18   Temp:  97.9 F (36.6 C) 98.1 F (36.7 C) 98.4 F (36.9 C)  TempSrc:  Oral Oral Oral  SpO2:  100% 100% 100%  Weight:   56.7 kg   Height:        Intake/Output Summary (Last 24 hours) at 05/28/2018 1708 Last data filed at 05/28/2018 0959 Gross per 24 hour  Intake 1592.32 ml  Output 1500 ml  Net 92.32 ml   Filed Weights   05/27/18 1656 05/28/18 0619  Weight: 54.4 kg 56.7 kg    Examination:  General exam: Appears calm and comfortable.  Exophthalmos. Respiratory system: Clear to auscultation. Respiratory effort normal. Cardiovascular system: S1 & S2 heard, tachycardia.   Gastrointestinal system: Abdomen is nondistended, soft and nontender. No organomegaly or masses felt. Normal bowel sounds heard. Central nervous system: Alert and oriented. No focal neurological deficits. Extremities: Symmetric 5 x 5 power.  Data Reviewed: I have personally reviewed following labs and imaging studies  CBC: Recent Labs  Lab 05/27/18 1708 05/28/18 0521  WBC 5.5 5.4  HGB 13.6 10.5*  HCT 38.9 31.9*  MCV 75.7* 79.2  PLT 319 230   Basic Metabolic Panel: Recent Labs  Lab 05/27/18 1801 05/28/18 0521  NA 138 139  K 3.7 3.6  CL 105 108  CO2 25 24  GLUCOSE 112* 101*  BUN 10 7  CREATININE 0.37* 0.34*  CALCIUM 9.4 9.5   GFR: Estimated Creatinine Clearance: 87.2 mL/min (A) (by C-G formula based on SCr of 0.34 mg/dL (L)). Liver Function Tests: Recent Labs  Lab 05/27/18 1801 05/28/18 0521  AST 59* 52*  ALT 89* 81*  ALKPHOS 61 55  BILITOT 0.5 0.7  PROT 6.5 5.8*  ALBUMIN 3.6 3.2*   Recent Labs  Lab 05/27/18 1801  LIPASE 36   No results for input(s): AMMONIA in the last 168 hours. Coagulation Profile: No results for input(s): INR, PROTIME in the last 168 hours. Cardiac Enzymes: Recent Labs  Lab 05/27/18 1708  TROPONINI <0.03   BNP (last 3 results) No results for input(s): PROBNP in the last 8760 hours. HbA1C: No results for  input(s): HGBA1C in the last 72 hours. CBG: Recent Labs  Lab 05/27/18 1809  GLUCAP 105*   Lipid Profile: No results for input(s): CHOL, HDL, LDLCALC, TRIG, CHOLHDL, LDLDIRECT in the last 72 hours. Thyroid Function Tests: Recent Labs    05/27/18 1801  TSH <0.010*   Anemia Panel: No results for input(s): VITAMINB12, FOLATE, FERRITIN, TIBC, IRON, RETICCTPCT in the last 72 hours. Urine analysis:    Component Value Date/Time   COLORURINE YELLOW 05/27/2018 1708   APPEARANCEUR CLEAR 05/27/2018 1708   LABSPEC 1.025 05/27/2018 1708   PHURINE 6.0 05/27/2018 1708   GLUCOSEU NEGATIVE 05/27/2018 1708   HGBUR MODERATE (A) 05/27/2018 1708   BILIRUBINUR NEGATIVE 05/27/2018 1708   BILIRUBINUR negative 03/31/2018 1718   BILIRUBINUR neg 03/15/2013 1600   KETONESUR NEGATIVE 05/27/2018 1708   PROTEINUR >300 (A) 05/27/2018 1708   UROBILINOGEN 2.0 (A) 03/31/2018 1718   NITRITE NEGATIVE 05/27/2018 1708   LEUKOCYTESUR NEGATIVE 05/27/2018 1708   Sepsis Labs: @LABRCNTIP (procalcitonin:4,lacticidven:4)  )No results found for this or any previous visit (from the past 240 hour(s)).       Radiology Studies: Dg Chest 2 View  Result Date: 05/27/2018 CLINICAL DATA:  Short of breath EXAM: CHEST - 2 VIEW COMPARISON:  None. FINDINGS: The heart size and mediastinal contours are within normal limits. Both lungs are clear. The visualized skeletal structures are unremarkable. IMPRESSION: No active cardiopulmonary disease. Electronically Signed   By: Jasmine Pang M.D.   On: 05/27/2018 18:57   Ct Head Wo Contrast  Result Date: 05/27/2018 CLINICAL DATA:  Headache, dizziness, near syncope EXAM: CT HEAD WITHOUT CONTRAST TECHNIQUE: Contiguous axial images were obtained from the base of the skull through the vertex without intravenous contrast. COMPARISON:  None. FINDINGS: Brain: No evidence of acute infarction, hemorrhage, hydrocephalus, extra-axial collection or mass lesion/mass effect. Vascular: No hyperdense  vessel or unexpected calcification. Skull: Intact Sinuses/Orbits: Mastoid air cells are clear. Visualized paranasal sinuses are clear. Other: Prominent adenoidal lymphoid tissue. IMPRESSION: No acute intracranial pathology. Electronically Signed   By: Jolaine Click M.D.   On: 05/27/2018 19:08   US Abdomen Limited Ruq  Result Date: 05/27/2018 CLINICAL DATA:  Tachycardia for 2 weeks, elevated liver function test. EXAM: ULTRASOUND ABDOMEN LIMITED RIGHT UPPER QUADRANT COMPARISON:  None. FINDINGS: Gallbladder: No gallstones or wall thickening visualized. No sonographic Murphy sign noted by sonographer. Common bile duct: Diameter: 1 mm Liver: No focal lesion identified. Within normal limits in parenchymal echogenicity. Portal triads are echogenic. Portal vein is patent on color Doppler imaging with normal direction of blood flow towards the liver. Trace ascites at Morison's pouch. IMPRESSION: 1. 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 Liver appears otherwise unremarkable. 2. Trace ascites in Morrison's pouch. 3. Gallbladder appears normal. No gallstones. No evidence of cholecystitis. Electronically Signed   By: Bary Richard M.D.   On: 05/27/2018 20:59        Scheduled Meds: . atenolol  25 mg Oral Daily  . enoxaparin (LOVENOX)  injection  40 mg Subcutaneous Q24H  . sodium chloride flush  3 mL Intravenous Q12H   Continuous Infusions: . sodium chloride    . cefTRIAXone (ROCEPHIN)  IV 1 g (05/28/18 0111)     LOS: 0 days    Time spent: 55 minutes.    Berton MountSylvester Dailee Manalang, MD  [facility] Pager #: [phone] 7PM-7AM contact night coverage as above

## 2018-05-28 NOTE — Progress Notes (Signed)
Some of the patient's orders have not been reconciled and cannot be released. The admitting MD has gone home and the on-call provider is not returning calls. Will report off in am for the orders to be reconciled by MD.

## 2018-05-28 NOTE — Progress Notes (Signed)
Echocardiogram 2D Echocardiogram has been performed.  Pieter PartridgeBrooke S Runette Scifres 05/28/2018, 9:43 AM

## 2018-05-29 LAB — CBC WITH DIFFERENTIAL/PLATELET
Abs Immature Granulocytes: 0 10*3/uL (ref 0.0–0.1)
Basophils Absolute: 0 10*3/uL (ref 0.0–0.1)
Basophils Relative: 0 %
Eosinophils Absolute: 0.1 10*3/uL (ref 0.0–0.7)
Eosinophils Relative: 2 %
HCT: 33.5 % — ABNORMAL LOW (ref 36.0–46.0)
Hemoglobin: 11.1 g/dL — ABNORMAL LOW (ref 12.0–15.0)
Immature Granulocytes: 0 %
Lymphocytes Relative: 45 %
Lymphs Abs: 2.6 10*3/uL (ref 0.7–4.0)
MCH: 25.9 pg — ABNORMAL LOW (ref 26.0–34.0)
MCHC: 33.1 g/dL (ref 30.0–36.0)
MCV: 78.3 fL (ref 78.0–100.0)
Monocytes Absolute: 1 10*3/uL (ref 0.1–1.0)
Monocytes Relative: 18 %
Neutro Abs: 2 10*3/uL (ref 1.7–7.7)
Neutrophils Relative %: 35 %
Platelets: 255 10*3/uL (ref 150–400)
RBC: 4.28 MIL/uL (ref 3.87–5.11)
RDW: 12.7 % (ref 11.5–15.5)
WBC: 5.9 10*3/uL (ref 4.0–10.5)

## 2018-05-29 LAB — URINE CULTURE

## 2018-05-29 LAB — THYROTROPIN RECEPTOR AUTOABS: Thyrotropin Receptor Ab: 26 IU/L — ABNORMAL HIGH (ref 0.00–1.75)

## 2018-05-29 LAB — HEPATITIS PANEL, ACUTE
HCV Ab: <0.1 s/co ratio (ref 0.0–0.9)
HEP B S AG: NEGATIVE
Hep A IgM: NEGATIVE
Hep B C IgM: NEGATIVE

## 2018-05-29 LAB — T4: T4 TOTAL: 22 ug/dL — AB (ref 4.5–12.0)

## 2018-05-29 MED ORDER — ATENOLOL 25 MG PO TABS: 25.0000 mg | ORAL_TABLET | Freq: Every day | ORAL | 0 refills | Status: DC

## 2018-05-29 MED ORDER — METHIMAZOLE 5 MG PO TABS: 15.0000 mg | ORAL_TABLET | Freq: Every day | ORAL | 0 refills | Status: DC

## 2018-05-29 NOTE — Discharge Summary (Signed)
Physician Discharge Summary  Patient ID: Nancy Mcconnell MRN: [id number] DOB/AGE: Oct 18, 1995 22 y.o.  Admit date: 05/27/2018 Discharge date: 05/29/2018  Admission Diagnoses:  Discharge Diagnoses:  Principal Problem:   Tachycardia Active Problems:   Hyperthyroidism   Abnormal liver function   Discharged Condition: stable  Hospital Course: Patient is a 22 year old African-American female with no significant past medical history.  Patient was admitted with generalized malaise, weakness, fatigue and significant sinus tachycardia.  Work-up revealed very low TSH and elevated free T4.  Patient also has exophthalmos.    Work-up revealed hypothyroidism.    Methimazole was started yesterday.  She will be discharged on oral methimazole 15 mg p.o. once daily.  Follow with the primary care provider and the endocrinologist for further care of the hyperthyroidism.  Sinus tachycardia has improved significantly.  We will continue atenolol.  Urine culture grew contaminants.  Patient has been on IV Rocephin.  PCP will manage and monitor abnormal liver function test and ultrasound findings.   Hyperthyroidism: Discharge patient on Methimazole 15 mg p.o. once daily and atenolol 25 mg p.o. once daily Follow with PCP and endocrinologist on discharge for further management.  Bacteriuria: Urine culture grew mixed organisms.    Sinus tachycardia: D-dimer was within normal range Sinus tachycardia is likely secondary to hyperthyroidism  Abnormal LFTs:  Further management as per patient's primary care provider.    Consults: None  Significant Diagnostic Studies: labs: TSH was less than 0.01 and the free T4 was 5.10.  Discharge Exam: Blood pressure 110/77, pulse 93, temperature 97.8 F (36.6 C), temperature source Oral, resp. rate 20, height 5\' 2"  (1.575 m), weight 55.7 kg, last menstrual period 04/19/2018, SpO2 100 %.   Disposition: Discharge disposition: 01-Home or Self Care   Discharge  Instructions    Diet - low sodium heart healthy   Complete by:  As directed    Increase activity slowly   Complete by:  As directed      Allergies as of 05/29/2018   No Known Allergies     Medication List    STOP taking these medications   ADULT GUMMY PO   metroNIDAZOLE 500 MG tablet Commonly known as:  FLAGYL     TAKE these medications   atenolol 25 MG tablet Commonly known as:  TENORMIN Take 1 tablet (25 mg total) by mouth daily. Start taking on:  05/30/2018   methimazole 5 MG tablet Commonly known as:  TAPAZOLE Take 3 tablets (15 mg total) by mouth daily. Start taking on:  05/30/2018      And 25 minutes spent spent discharging this patient.  SignedBarnetta Chapel: Chirstine Defrain I Marquet Faircloth 05/29/2018, 1:30 PM

## 2018-05-31 LAB — T3, FREE: T3, Free: 26 pg/mL — ABNORMAL HIGH (ref 2.0–4.4)

## 2018-06-27 ENCOUNTER — Telehealth: Payer: Self-pay | Admitting: Endocrinology

## 2018-06-27 NOTE — Telephone Encounter (Signed)
-----   Message from [place] sent at 06/24/2018 10:43 AM EDT ----- Regarding: Referral Patients is wanting to be seen in our office for her thyroid.  She was recently in [facility] for this.   Could you review her chart and let me know if it is okay to schedule her?

## 2018-06-27 NOTE — Telephone Encounter (Signed)
OK 

## 2018-06-28 NOTE — Telephone Encounter (Signed)
Thanks!  Patient has been called.

## 2018-06-28 NOTE — Telephone Encounter (Signed)
Ok to schedule.

## 2018-07-01 ENCOUNTER — Encounter

## 2018-07-01 ENCOUNTER — Inpatient Hospital Stay: Payer: Managed Care, Other (non HMO) | Admitting: Physician Assistant

## 2018-07-15 ENCOUNTER — Encounter: Payer: Self-pay | Admitting: Physician Assistant

## 2018-07-16 ENCOUNTER — Other Ambulatory Visit: Payer: Self-pay

## 2018-07-16 ENCOUNTER — Encounter: Payer: Self-pay | Admitting: Gastroenterology

## 2018-07-16 ENCOUNTER — Encounter: Payer: Self-pay | Admitting: Physician Assistant

## 2018-07-16 ENCOUNTER — Inpatient Hospital Stay: Payer: Self-pay | Admitting: Physician Assistant

## 2018-07-16 ENCOUNTER — Ambulatory Visit (INDEPENDENT_AMBULATORY_CARE_PROVIDER_SITE_OTHER): Payer: No Typology Code available for payment source | Admitting: Physician Assistant

## 2018-07-16 ENCOUNTER — Inpatient Hospital Stay: Payer: Managed Care, Other (non HMO) | Admitting: Physician Assistant

## 2018-07-16 ENCOUNTER — Encounter

## 2018-07-16 VITALS — BP 105/66 | HR 72 | Temp 98.3°F | Resp 16 | Ht 64.5 in | Wt 125.6 lb

## 2018-07-16 DIAGNOSIS — E0590 Thyrotoxicosis, unspecified without thyrotoxic crisis or storm: Secondary | ICD-10-CM | POA: Diagnosis not present

## 2018-07-16 DIAGNOSIS — R945 Abnormal results of liver function studies: Secondary | ICD-10-CM

## 2018-07-16 MED ORDER — ATENOLOL 25 MG PO TABS: 25.0000 mg | ORAL_TABLET | Freq: Every day | ORAL | 0 refills | Status: DC

## 2018-07-16 MED ORDER — METHIMAZOLE 5 MG PO TABS: 15.0000 mg | ORAL_TABLET | Freq: Every day | ORAL | 1 refills | Status: DC

## 2018-07-16 NOTE — Addendum Note (Signed)
Addended by: Benjiman Core D on: 07/16/2018 01:37 PM   Modules accepted: Orders

## 2018-07-16 NOTE — Patient Instructions (Addendum)
  You should hear from the endocrinologist and hepatologist within the next 2 weeks.   If you have lab work done today you will be contacted with your lab results within the next 2 weeks.  If you have not heard from [place] then please contact us. The fastest way to get your results is to register for My Chart.   IF you received an x-ray today, you will receive an invoice from [facility]. Please contact [facility] at [phone] with questions or concerns regarding your invoice.   IF you received labwork today, you will receive an invoice from [place]. Please contact [facility] at [phone] with questions or concerns regarding your invoice.   Our billing staff will not be able to assist you with questions regarding bills from these companies.  You will be contacted with the lab results as soon as they are available. The fastest way to get your results is to activate your My Chart account. Instructions are located on the last page of this paperwork. If you have not heard from [place] regarding the results in 2 weeks, please contact this office.

## 2018-07-16 NOTE — Progress Notes (Signed)
MRN: [id number] DOB: [birth year]  Subjective:   Nancy Mcconnell is a 22 y.o. female presenting for follow up on hospital visit in 05/2018. Dx with hyperthyroidism. Started on methimazole 5mg  (15mg  daily) and atenolol 25mg . Her sx have improved since then. Still having some bouts of fatigue. Denies increased heart rate, chest pain, constipation, diarrhea, hair thinning, and dry brittle skin. Is almost out of medication. Has not followed up with endo yet, needs referral. No FH of thyroid disorder.  Also found to have elevated liver enzymes but she cannot remember what they told her. Drinks 2 drinks once a month. Does not take tylenol. Eats healthy well balanced diet.  No FH of hepatitis.   Denies any other aggravating or relieving factors, no other questions or concerns.  Nancy Mcconnell has a current medication list which includes the following prescription(s): atenolol, etonogestrel, and methimazole, and the following Facility-Administered Medications: etonogestrel. Also has No Known Allergies.  [place]  has a past medical history of Medical history non-contributory and Tachycardia (05/27/2018). Also  has a past surgical history that includes No past surgeries.    Social History   Socioeconomic History  . Marital status: Single    Spouse name: Not on file  . Number of children: 0  . Years of education: Not on file  . Highest education level: Not on file  Occupational History  . Not on file  Social Needs  . Financial resource strain: Not on file  . Food insecurity:    Worry: Not on file    Inability: Not on file  . Transportation needs:    Medical: Not on file    Non-medical: Not on file  Tobacco Use  . Smoking status: Never Smoker  . Smokeless tobacco: Never Used  Substance and Sexual Activity  . Alcohol use: Yes    Comment: occ  . Drug use: No  . Sexual activity: Yes    Birth control/protection: Implant  Lifestyle  . Physical activity:    Days per week: Not on file   Minutes per session: Not on file  . Stress: Not on file  Relationships  . Social connections:    Talks on phone: Not on file    Gets together: Not on file    Attends religious service: Not on file    Active member of club or organization: Not on file    Attends meetings of clubs or organizations: Not on file    Relationship status: Not on file  . Intimate partner violence:    Fear of current or ex partner: Not on file    Emotionally abused: Not on file    Physically abused: Not on file    Forced sexual activity: Not on file  Other Topics Concern  . Not on file  Social History Narrative   ** Merged History Encounter **        Objective:   Vitals: BP 105/66 (BP Location: Right Arm, Patient Position: Sitting, Cuff Size: Normal)   Pulse 72   Temp 98.3 F (36.8 C) (Oral)   Resp 16   Ht 5' 4.5" (1.638 m)   Wt 125 lb 9.6 oz (57 kg)   SpO2 100%   BMI 21.23 kg/m   Physical Exam  Constitutional: She is oriented to person, place, and time. She appears well-developed and well-nourished. No distress.  HENT:  Head: Normocephalic and atraumatic.  Eyes: Conjunctivae are normal.  Neck: Normal range of motion.  Cardiovascular: Normal rate, regular rhythm and normal  heart sounds.  Pulmonary/Chest: Effort normal.  Neurological: She is alert and oriented to person, place, and time.  Skin: Skin is warm and dry.  Psychiatric: She has a normal mood and affect.  Vitals reviewed.   No results found for this or any previous visit (from the past 24 hour(s)).  BP Readings from Last 3 Encounters:  07/16/18 105/66  05/29/18 110/77  03/31/18 110/80   [place] RUQ 05/27/18 findings: IMPRESSION: 1. 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 Liver appears otherwise unremarkable. 2. Trace ascites in Morrison's pouch. 3. Gallbladder appears normal. No gallstones. No evidence of cholecystitis.  Assessment and Plan :    1. Hyperthyroidism Labs pending. Sx improving. Refills provided. Will need to f/u with endocrinology. Referral placed.  - Ambulatory referral to Endocrinology - CBC with Differential/Platelet - TSH - Basic metabolic panel - T4, Free  2. Abnormal liver function RUQ [place] results as above. Hepatitis panel negative. ALT and AST slightly elevated. Infrequent use of alcohol, no use of OTC tylenol. Referral to hepatology for further evaluation and management.  - Amb Referral to Hepatology - Hepatic Function Panel  Benjiman Core, PA-C  [facility] at [facility] 07/16/2018 12:17 PM

## 2018-07-17 LAB — HEPATIC FUNCTION PANEL
ALK PHOS: 80 IU/L (ref 39–117)
ALT: 34 IU/L — ABNORMAL HIGH (ref 0–32)
AST: 28 IU/L (ref 0–40)
Albumin: 4.4 g/dL (ref 3.5–5.5)
Bilirubin Total: 0.4 mg/dL (ref 0.0–1.2)
Bilirubin, Direct: 0.13 mg/dL (ref 0.00–0.40)
TOTAL PROTEIN: 6.7 g/dL (ref 6.0–8.5)

## 2018-07-17 LAB — BASIC METABOLIC PANEL
BUN / CREAT RATIO: 18 (ref 9–23)
BUN: 7 mg/dL (ref 6–20)
CO2: 21 mmol/L (ref 20–29)
Calcium: 9.8 mg/dL (ref 8.7–10.2)
Chloride: 102 mmol/L (ref 96–106)
Creatinine, Ser: 0.39 mg/dL — ABNORMAL LOW (ref 0.57–1.00)
GFR calc Af Amer: 171 mL/min/{1.73_m2} (ref >59)
GFR calc non Af Amer: 149 mL/min/{1.73_m2} (ref >59)
GLUCOSE: 94 mg/dL (ref 65–99)
Potassium: 3.5 mmol/L (ref 3.5–5.2)
SODIUM: 140 mmol/L (ref 134–144)

## 2018-07-17 LAB — CBC WITH DIFFERENTIAL/PLATELET
Basophils Absolute: 0 10*3/uL (ref 0.0–0.2)
Basos: 1 %
EOS (ABSOLUTE): 0.1 10*3/uL (ref 0.0–0.4)
Eos: 2 %
Hematocrit: 36.6 % (ref 34.0–46.6)
Hemoglobin: 12.3 g/dL (ref 11.1–15.9)
Immature Grans (Abs): 0 10*3/uL (ref 0.0–0.1)
Immature Granulocytes: 0 %
LYMPHS ABS: 1.8 10*3/uL (ref 0.7–3.1)
Lymphs: 30 %
MCH: 26.1 pg — ABNORMAL LOW (ref 26.6–33.0)
MCHC: 33.6 g/dL (ref 31.5–35.7)
MCV: 78 fL — ABNORMAL LOW (ref 79–97)
MONOS ABS: 0.6 10*3/uL (ref 0.1–0.9)
Monocytes: 9 %
Neutrophils Absolute: 3.5 10*3/uL (ref 1.4–7.0)
Neutrophils: 58 %
Platelets: 372 10*3/uL (ref 150–450)
RBC: 4.71 x10E6/uL (ref 3.77–5.28)
RDW: 13.7 % (ref 12.3–15.4)
WBC: 6 10*3/uL (ref 3.4–10.8)

## 2018-07-17 LAB — TSH

## 2018-07-17 LAB — T4, FREE: Free T4: 1.61 ng/dL (ref 0.82–1.77)

## 2018-08-02 ENCOUNTER — Encounter: Payer: Self-pay | Admitting: Internal Medicine

## 2018-08-02 ENCOUNTER — Ambulatory Visit: Payer: Self-pay | Admitting: Internal Medicine

## 2018-08-02 ENCOUNTER — Other Ambulatory Visit: Payer: Self-pay

## 2018-08-02 VITALS — BP 121/81 | HR 76 | Resp 17 | Ht 65.0 in | Wt 128.5 lb

## 2018-08-02 DIAGNOSIS — R7989 Other specified abnormal findings of blood chemistry: Secondary | ICD-10-CM

## 2018-08-02 DIAGNOSIS — E0590 Thyrotoxicosis, unspecified without thyrotoxic crisis or storm: Secondary | ICD-10-CM

## 2018-08-02 DIAGNOSIS — R945 Abnormal results of liver function studies: Secondary | ICD-10-CM

## 2018-08-02 DIAGNOSIS — E0500 Thyrotoxicosis with diffuse goiter without thyrotoxic crisis or storm: Secondary | ICD-10-CM

## 2018-08-02 LAB — TSH: TSH: <0.01 u[IU]/mL — ABNORMAL LOW (ref 0.35–4.50)

## 2018-08-02 LAB — T4, FREE: FREE T4: 1 ng/dL (ref 0.60–1.60)

## 2018-08-02 NOTE — Progress Notes (Signed)
Name: Nancy Mcconnell  MRN/ DOB: [id number], [birth year]    Age/ Sex: 22 y.o., female    PCP: Sherren Mocha, MD   Reason for Endocrinology Evaluation: Hyperthyroidism      Date of Initial Endocrinology Evaluation: 08/02/2018     HPI: Nancy Mcconnell is a 23 y.o. female with a past medical history of Hyperthyroidism. The patient presented for initial endocrinology clinic visit on 08/02/2018 for consultative assistance with her hyperthyroidism .   Pt presented to the ED on 05/27/18 with c/o dizziness, nausea and vomiting. She was found to be tachycardic with abnormal liver enzymes. Labs revealed suppressed TSH at < 0.010 uIU/mL with elevated FT4. She was started on Methimazole at the time.   Today she is accompanied by mother. Prior to her presentation she was having fatigue that started in July, 2019 while on a trip to [place]. She has lost ~ 10 lbs in the past few months. Has occasional heat intolerance. She denies diarrhea or palpitations. Tremors have improved since being on medications.   She denies any local neck symptoms such as swelling, pain or dysphagia  Has noted bulging eyes since September. No itching , gritty sensations or tearing.   She is planning on starting a family within the next 2 yrs.  Cousin passed away with Lupus.    She is studying for [employer]  No tobacco use   HISTORY:  Past Medical History:  Past Medical History:  Diagnosis Date  . Medical history non-contributory   . Tachycardia 05/27/2018   Past Surgical History:  Past Surgical History:  Procedure Laterality Date  . NO PAST SURGERIES        Social History:  reports that she has never smoked. She has never used smokeless tobacco. She reports that she drinks alcohol. She reports that she does not use drugs.  Family History: family history includes Healthy in her brother, brother, and mother; Heart failure in her father.   HOME MEDICATIONS: Current Outpatient Medications on File Prior to Visit    Medication Sig Dispense Refill  . atenolol (TENORMIN) 25 MG tablet Take 1 tablet (25 mg total) by mouth daily. 90 tablet 0  . etonogestrel ([name]) 68 MG IMPL implant 1 each by Subdermal route once.    . methimazole (TAPAZOLE) 5 MG tablet Take 3 tablets (15 mg total) by mouth daily. 120 tablet 1   Current Facility-Administered Medications on File Prior to Visit  Medication Dose Route Frequency Provider Last Rate Last Dose  . etonogestrel ([name]) implant 68 mg  68 mg Subdermal Once English, Stephanie D, PA          REVIEW OF SYSTEMS: A comprehensive ROS was conducted with the patient and is negative except as per HPI and below:  ROS     OBJECTIVE:  VS: BP 121/81   Pulse 76   Resp 17   Ht 5\' 5"  (1.651 m)   Wt 128 lb 8 oz (58.3 kg)   SpO2 98%   BMI 21.38 kg/m    Wt Readings from Last 3 Encounters:  08/02/18 128 lb 8 oz (58.3 kg)  07/16/18 125 lb 9.6 oz (57 kg)  05/29/18 122 lb 11.2 oz (55.7 kg)     EXAM: General: Pt appears well and is in NAD  Hydration: Well-hydrated with moist mucous membranes and good skin turgor  Eyes: External eye exam shows stare, with lid lag and minimal exophthalmos.  EOM intact.   Ears, Nose, Throat: Hearing: Grossly intact  bilaterally Dental: Good dentition  Throat: Clear without mass, erythema or exudate  Neck: General: Supple without adenopathy. Thyroid: Thyroid enlarged ~ 70 grams.  No nodules appreciated.Right  thyroid bruit present.   Lungs: Clear with good BS bilat with no rales, rhonchi, or wheezes  Heart: Auscultation: RRR.  Abdomen: Normoactive bowel sounds, soft, nontender, without masses or organomegaly palpable  Extremities:  BL LE: No pretibial edema normal ROM and strength.  Skin: Hair: Texture and amount normal with gender appropriate distribution Skin Inspection: No rashes, acanthosis nigricans/skin tags.  Skin Palpation: Skin temperature, texture, and thickness normal to palpation  Neuro: Cranial nerves: II - XII  grossly intact  Motor: Normal strength throughout DTRs: 2+ and symmetric in UE without delay in relaxation phase  Mental Status: Judgment, insight: Intact Orientation: Oriented to time, place, and person Mood and affect: No depression, anxiety, or agitation     DATA REVIEWED:  Thyrotropin Receptor Ab 0.00 - 1.75 IU/L 26.00High     Results for Nancy Mcconnell, Nancy Mcconnell (MRN [id number]) as of 08/02/2018 07:55  Ref. Range 09/05/2016 13:35 05/27/2018 18:01 05/28/2018 17:05 05/28/2018 17:17 07/16/2018 16:08  TSH Latest Ref Range: 0.450 - 4.500 uIU/mL 2.510 <0.010 (L)   <0.006 (L)  Triiodothyronine,Free,Serum Latest Ref Range: 2.0 - 4.4 pg/mL    26.0 (H)   T4,Free(Direct) Latest Ref Range: 0.82 - 1.77 ng/dL   4.095.10 (H)  8.111.61  Thyroxine (T4) Latest Ref Range: 4.5 - 12.0 ug/dL  91.422.0 (H)      Results for Nancy Mcconnell, Nancy Mcconnell (MRN [id number]) as of 08/03/2018 08:38  Ref. Range 08/02/2018 15:45  TSH Latest Ref Range: 0.35 - 4.50 uIU/mL <0.01 Repeated and verified X2. (L)  Triiodothyronine (T3) Latest Ref Range: 76 - 181 ng/dL 086168  V7,QION(GEXBMWT4,Free(Direct) Latest Ref Range: 0.60 - 1.60 ng/dL 4.131.00   ASSESSMENT/PLAN/RECOMMENDATIONS:   1. Hyperthyroidism Secondary to Graves' Disease :  - She is clinically euthyroid.  -We discussed that hyperthyroidism is a result of an autoimmune condition involving the thyroid.   - We discussed with pt the benefits of methimazole in the Tx of hyperthyroidism, as well as the possible side effects/complications of anti-thyroid drug Tx (specifically detailing the rare, but serious side effect of agranulocytosis). She was informed of need for regular thyroid function monitoring while on methimazole to ensure appropriate dosage without over-treatment. As well, we discussed the possible side effects of methimazole including the chance of rash, the small chance of liver irritation/juandice and the <=1 in 300-400 chance of sudden onset agranulocytosis.  We discussed importance of going to ED  promptly (and stopping methimazole) if shewere to develop significant fever with severe sore throat of other evidence of acute infection.     - We extensively discussed the various treatment options for hyperthyroidism and Graves disease including ablation therapy with radioactive iodine versus antithyroid drug treatment versus surgical therapy.  We recommended to the patient that we felt, at this time, that thionamide therapy would be most optimal.  We discussed the various possible benefits versus side effects of the various therapies.   - I carefully explained to the patient that one of the consequences of I-131 ablation treatment would likely be permanent hypothyroidism which would require long-term replacement therapy with LT4.  Pt also advised to avoid conceiving at this time. I explained to her that if she goes to remission, the risk of relapse is high with pregnancy.   - Repeat TFT's  reviewed and patient will need a decrease in Methimazole dose as below  Medications   Decrease Methimazole to 10 mg daily Continue Atenolol 25 mg daily     2. Graves' Disease: - Pt with minimal exophthalmos. 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 Abnormal Liver Function Tests:  - RUQ ultrasound is non-specific.  - This is most likely secondary to Hyperthyroidism, this should normalize once she is euthyroid.  - Reassurance provided at this time.    F/u in 6 weeks, labs in 3 weeks   Addendum:left a message on voice mail on 11/19 @ 07:35 with instructions and call back #.    Signed electronically by: Lyndle Herrlich, MD  [facility] Endocrin[facility]  [facility] [address] [place], [place] [address] Phone: [phone] FAX: [phone]   CC: Sherren Mocha, MD [address] [place] [address] Phone: [phone] Fax: [phone]   Return to Endocrinology clinic as below: Future Appointments  Date Time Provider  Department Center  08/17/2018  9:00 AM Tressia Danas, MD [facility]

## 2018-08-02 NOTE — Patient Instructions (Signed)
We recommend that you follow these hyperthyroidism instructions at home:  1) Take Methimazole 15 mg once  a day  If you develop severe sore throat with high fevers OR develop unexplained yellowing of your skin, eyes, under your tongue, severe abdominal pain with nausea or vomiting --> then please get evaluated immediately.  2) Atenolol 25 mg one times a day 3) Get repeat thyroid labs in 3 weeks.   It is ESSENTIAL to get follow-up labs to help avoid over or undertreatment of your hyperthyroidism - both of which can be dangerous to your health.

## 2018-08-03 ENCOUNTER — Telehealth: Payer: Self-pay | Admitting: Internal Medicine

## 2018-08-03 ENCOUNTER — Other Ambulatory Visit: Payer: Self-pay | Admitting: Internal Medicine

## 2018-08-03 DIAGNOSIS — E0500 Thyrotoxicosis with diffuse goiter without thyrotoxic crisis or storm: Secondary | ICD-10-CM

## 2018-08-03 LAB — T3: T3, Total: 168 ng/dL (ref 76–181)

## 2018-08-03 MED ORDER — METHIMAZOLE 5 MG PO TABS: 10.0000 mg | ORAL_TABLET | Freq: Every day | ORAL | 3 refills | Status: DC

## 2018-08-03 NOTE — Telephone Encounter (Signed)
Left a message for the pt to decrease Methimazole from 15 mg to 10 mg.   Pt advised to call back with any questions.   Abby Raelyn MoraJaralla , MD  [facility]  [facility] [address] [place], [place] [address] Phone: [phone] FAX: [phone]

## 2018-08-16 ENCOUNTER — Encounter: Payer: Self-pay | Admitting: *Deleted

## 2018-08-17 ENCOUNTER — Ambulatory Visit: Payer: Self-pay | Admitting: Gastroenterology

## 2018-08-24 ENCOUNTER — Other Ambulatory Visit (INDEPENDENT_AMBULATORY_CARE_PROVIDER_SITE_OTHER): Payer: Self-pay

## 2018-08-24 ENCOUNTER — Ambulatory Visit: Payer: Self-pay | Admitting: Internal Medicine

## 2018-08-24 DIAGNOSIS — E0500 Thyrotoxicosis with diffuse goiter without thyrotoxic crisis or storm: Secondary | ICD-10-CM

## 2018-08-24 LAB — T4, FREE: FREE T4: 0.74 ng/dL (ref 0.60–1.60)

## 2018-08-24 LAB — TSH: TSH: <0.01 u[IU]/mL — ABNORMAL LOW (ref 0.35–4.50)

## 2018-08-25 ENCOUNTER — Telehealth: Payer: Self-pay | Admitting: Internal Medicine

## 2018-08-25 NOTE — Telephone Encounter (Addendum)
Attempted to call the patient twice on her listed # , but unable to leave a  Message as the mail box is full.  I left a voice mail on her mother's voice mail to call us back.      Abby Raelyn MoraJaralla Lular Letson, MD  [facility]  [facility] [address] [place], [place] [address] Phone: [phone] FAX: [phone]   No call by 12/13 A letter was mailed.

## 2018-08-27 ENCOUNTER — Encounter: Payer: Self-pay | Admitting: Internal Medicine

## 2018-09-01 ENCOUNTER — Telehealth: Payer: Self-pay | Admitting: Internal Medicine

## 2018-09-01 ENCOUNTER — Other Ambulatory Visit: Payer: Self-pay

## 2018-09-01 MED ORDER — METHIMAZOLE 5 MG PO TABS: 5.0000 mg | ORAL_TABLET | Freq: Two times a day (BID) | ORAL | 3 refills | Status: DC

## 2018-09-01 MED ORDER — METHIMAZOLE 5 MG PO TABS: 5.0000 mg | ORAL_TABLET | Freq: Every day | ORAL | 3 refills | Status: DC

## 2018-09-01 NOTE — Telephone Encounter (Signed)
According to Pt. Medication list pt is taking methinmazole 5mg  2 tabs BID. Please advise.

## 2018-09-01 NOTE — Telephone Encounter (Signed)
[facility] caller Nancy MunroeYasemine Mcconnell states that she is returning a missed call from Dr Deloria LairAbby regarding her daughters lab results

## 2018-09-01 NOTE — Telephone Encounter (Signed)
Pt. Mother Maebelle Munroe(Yasemine Wanamaker) returning call for daugther's lab results. Please advise.

## 2018-09-01 NOTE — Telephone Encounter (Signed)
Pt dose confirmed with MD Shamleffer. Pt expressed understanding to cut dose down from 5mg  2 tables BID to 5mg . Pt expressed understanding.

## 2018-09-01 NOTE — Telephone Encounter (Signed)
I need to know how much of methimazole is the daughter taking ? Her thyroid is still over active but getting better , but before I change anything I need to know how much she is on    Thanks

## 2018-09-13 ENCOUNTER — Ambulatory Visit: Payer: Self-pay | Admitting: Internal Medicine

## 2018-09-20 ENCOUNTER — Ambulatory Visit: Payer: Self-pay | Admitting: Gastroenterology

## 2018-09-20 ENCOUNTER — Ambulatory Visit: Payer: Self-pay | Admitting: Internal Medicine

## 2018-09-20 NOTE — Progress Notes (Deleted)
Name: Nancy Mcconnell [place]  MRN/ DOB: [id number], 12-31-1995    Age/ Sex: 23 y.o., female     PCP: Nancy MochaShaw, Eva N, MD   Reason for Endocrinology Evaluation: Hyperthyroidism     Initial Endocrinology Clinic Visit: 08/02/18    PATIENT IDENTIFIER: Nancy Mcconnell is a 23 y.o., female with a past medical history of hyperthyroidism. She has followed with [facility] since 08/02/18 for consultative assistance with management of her hyperthyroidism   HISTORICAL SUMMARY: Pt presented to the ED on 05/27/18 with c/o dizziness, nausea and vomiting. She was found to be tachycardic with abnormal liver enzymes. Labs revealed suppressed TSH at < 0.010 uIU/mL with elevated FT4. She was started on Methimazole at the time.    SUBJECTIVE:   During last visit (***): ***  Today (09/20/2018):  Nancy Mcconnell {has/has not:9025} followed the recommended instructions, ***. ***   ROS:  As per HPI.   HISTORY:  Past Medical History:  Past Medical History:  Diagnosis Date  . Medical history non-contributory   . Tachycardia 05/27/2018    Past Surgical History:  Past Surgical History:  Procedure Laterality Date  . NO PAST SURGERIES       Social History:  reports that she has never smoked. She has never used smokeless tobacco. She reports current alcohol use. She reports that she does not use drugs.  Family History: family history includes Healthy in her brother, brother, and mother; Heart failure in her father.   HOME MEDICATIONS: Allergies as of 09/20/2018   No Known Allergies     Medication List       Accurate as of September 20, 2018  1:00 PM. Always use your most recent med list.        atenolol 25 MG tablet Commonly known as:  TENORMIN Take 1 tablet (25 mg total) by mouth daily.   etonogestrel 68 MG Impl implant Commonly known as:  [name] 1 each by Subdermal route once.   methimazole 5 MG tablet Commonly known as:  TAPAZOLE Take 1 tablet (5 mg total) by mouth 2  (two) times daily.          OBJECTIVE:   PHYSICAL EXAM: VS: There were no vitals taken for this visit.   EXAM: General: Pt appears well and is in NAD  Hydration: Well-hydrated with moist mucous membranes and good skin turgor  Eyes: External eye exam normal without stare, lid lag or exophthalmos.  EOM intact.  Ears, Nose, Throat: Hearing: Grossly intact bilaterally Dental: Good dentition  Throat: Clear without mass, erythema or exudate  Neck: General: Supple without adenopathy. Thyroid: Thyroid size normal.  No goiter or nodules appreciated. No thyroid bruit.  Lungs: Clear with good BS bilat with no rales, rhonchi, or wheezes  Heart: Auscultation: RRR.  Abdomen: Normoactive bowel sounds, soft, nontender, without masses or organomegaly palpable  Extremities:  BL LE: No pretibial edema normal ROM and strength.  Neuro: Cranial nerves: II - XII grossly intact  Cerebellar: Normal coordination and movement; no tremor Motor: Normal strength throughout DTRs: 2+ and symmetric in UE without delay in relaxation phase  Mental Status: Judgment, insight: Intact Orientation: Oriented to time, place, and person Mood and affect: No depression, anxiety, or agitation     DATA REVIEWED: Results for Nancy PomfretYEBOAH, Nancy Mcconnell (MRN [id number]) as of 09/20/2018 12:58  Ref. Range 08/24/2018 14:43  TSH Latest Ref Range: 0.35 - 4.50 uIU/mL <0.01 (L)  T4,Free(Direct) Latest Ref Range: 0.60 - 1.60 ng/dL 0.860.74   Results  for Nancy Mcconnell, Nancy Mcconnell (MRN [id number]) as of 09/20/2018 12:58  Ref. Range 05/27/2018 22:51  Thyrotropin Receptor Ab Latest Ref Range: 0.00 - 1.75 IU/L 26.00 (H)     ASSESSMENT / PLAN / RECOMMENDATIONS:   1. Hyperthyroidism Secondary to Graves' Disease :   Plan:  ***    Medications   ***   Signed electronically by: Lyndle Herrlich, MD  [facility]  [facility] [address] [place], [place] [address] Phone: [phone] FAX:  [phone]      CC: Nancy Mocha, MD [address] [place] [address] Phone: [phone]  Fax: [phone]   Return to Endocrinology clinic as below: Future Appointments  Date Time Provider Department Center  09/20/2018  1:40 PM , Konrad Dolores, MD [facility] None  09/20/2018  3:30 PM Tressia Danas, MD [facility]

## 2018-09-20 NOTE — Progress Notes (Deleted)
Referring Provider: Magdalene RiverWiseman, Brittany D, PA* Primary Care Physician:  Sherren MochaShaw, Eva N, MD   Reason for Consultation:  Abnormal liver function tests   IMPRESSION:  ***  PLAN: ***   HPI: Nancy Mcconnell is a 23 y.o. female seen in consultation at the request of PA Barnett AbuWiseman for further evaluation of abnormal liver enzymes.   She was diagnosed with hyperthyroidism 05/27/2018 when she presented to the emergency room with dizziness, nausea, and vomiting.  Her ALT was found to be elevated at that time.  She was started on methimazole.  Her endocrinologist felt that the elevated ALT was most likely due to her hyperthyroidism and expected it to normalize when she was euthyroid.    She is lost 10 pounds over the last few months.  ALT has been elevated since 2014 -  58, 89, 34. Her other liver enzymes have been normal.   Abdominal ultrasound 05/27/2018 showed echogenic portal triads with a starry sky appearance throughout the liver.  The radiologist suggested the possibility of hepatitis.  The gallbladder appeared normal.  Portal vein was normal.   Quality: Amount: Duration: Timing: Progression: Chronicity: Context: Similar prior episodes: Relieved by: Worsened by: Effective treatments: Ineffective treatments: Associated symptoms: Risks factors:   Past Medical History:  Diagnosis Date  . Medical history non-contributory   . Tachycardia 05/27/2018    Past Surgical History:  Procedure Laterality Date  . NO PAST SURGERIES      Current Outpatient Medications  Medication Sig Dispense Refill  . atenolol (TENORMIN) 25 MG tablet Take 1 tablet (25 mg total) by mouth daily. 90 tablet 0  . etonogestrel ([name]) 68 MG IMPL implant 1 each by Subdermal route once.    . methimazole (TAPAZOLE) 5 MG tablet Take 1 tablet (5 mg total) by mouth 2 (two) times daily. 60 tablet 3   Current Facility-Administered Medications  Medication Dose Route Frequency Provider Last Rate Last Dose  .  etonogestrel ([name]) implant 68 mg  68 mg Subdermal Once English, Stephanie D, [place]        Allergies as of 09/20/2018  . (No Known Allergies)    Family History  Problem Relation Age of Onset  . Healthy Mother   . Heart failure Father   . Healthy Brother   . Healthy Brother     Social History   Socioeconomic History  . Marital status: Single    Spouse name: Not on file  . Number of children: 0  . Years of education: Not on file  . Highest education level: Not on file  Occupational History  . Not on file  Social Needs  . Financial resource strain: Not on file  . Food insecurity:    Worry: Not on file    Inability: Not on file  . Transportation needs:    Medical: Not on file    Non-medical: Not on file  Tobacco Use  . Smoking status: Never Smoker  . Smokeless tobacco: Never Used  Substance and Sexual Activity  . Alcohol use: Yes    Comment: occ  . Drug use: No  . Sexual activity: Yes    Birth control/protection: Implant  Lifestyle  . Physical activity:    Days per week: Not on file    Minutes per session: Not on file  . Stress: Not on file  Relationships  . Social connections:    Talks on phone: Not on file    Gets together: Not on file    Attends religious service: Not  on file    Active member of club or organization: Not on file    Attends meetings of clubs or organizations: Not on file    Relationship status: Not on file  . Intimate partner violence:    Fear of current or ex partner: Not on file    Emotionally abused: Not on file    Physically abused: Not on file    Forced sexual activity: Not on file  Other Topics Concern  . Not on file  Social History Narrative   ** Merged History Encounter **        Review of Systems: 12 system ROS is negative except as noted above.  There were no vitals filed for this visit.  Physical Exam: Vital signs were reviewed. General:   Alert, well-nourished, pleasant and cooperative in NAD Head:  Normocephalic  and atraumatic. Eyes:  Sclera clear, no icterus.   Conjunctiva pink. Mouth:  No deformity or lesions.   Neck:  Supple; no thyromegaly. Lungs:  Clear throughout to auscultation.   No wheezes.  Heart:  Regular rate and rhythm; no murmurs Abdomen:  Soft, nontender, normal bowel sounds. No rebound or guarding. No hepatosplenomegaly Rectal:  Deferred  Msk:  Symmetrical without gross deformities. Extremities:  No gross deformities or edema. Neurologic:  Alert and  oriented x4;  grossly nonfocal Skin:  No rash or bruise. Psych:  Alert and cooperative. Normal mood and affect.   Nancy Mcconnell. Orvan Falconer, MD, MPH [facility] 09/20/2018, 11:34 AM

## 2018-09-24 ENCOUNTER — Encounter: Payer: Self-pay | Admitting: Internal Medicine

## 2018-09-24 ENCOUNTER — Ambulatory Visit (INDEPENDENT_AMBULATORY_CARE_PROVIDER_SITE_OTHER): Payer: BLUE CROSS/BLUE SHIELD | Admitting: Internal Medicine

## 2018-09-24 VITALS — BP 112/70 | HR 99 | Ht 65.0 in | Wt 129.8 lb

## 2018-09-24 DIAGNOSIS — E0590 Thyrotoxicosis, unspecified without thyrotoxic crisis or storm: Secondary | ICD-10-CM | POA: Diagnosis not present

## 2018-09-24 DIAGNOSIS — E0500 Thyrotoxicosis with diffuse goiter without thyrotoxic crisis or storm: Secondary | ICD-10-CM | POA: Diagnosis not present

## 2018-09-24 LAB — T4, FREE: Free T4: 2.12 ng/dL — ABNORMAL HIGH (ref 0.60–1.60)

## 2018-09-24 LAB — TSH: TSH: 0 u[IU]/mL — ABNORMAL LOW (ref 0.35–4.50)

## 2018-09-24 MED ORDER — METHIMAZOLE 5 MG PO TABS: 15.0000 mg | ORAL_TABLET | Freq: Every day | ORAL | 3 refills | Status: DC

## 2018-09-24 MED ORDER — METHIMAZOLE 5 MG PO TABS: 5.0000 mg | ORAL_TABLET | Freq: Every day | ORAL | 3 refills | Status: DC

## 2018-09-24 NOTE — Progress Notes (Signed)
Name: Nancy PomfretCecilia D Mcconnell  MRN/ DOB: [id number], 1995-09-22    Age/ Sex: 23 y.o., female     PCP: Sherren MochaShaw, Eva N, MD   Reason for Endocrinology Evaluation: Hyperthyroidism     Initial Endocrinology Clinic Visit: 08/02/18    PATIENT IDENTIFIER: Ms. Nancy PomfretCecilia D Nancy Mcconnell is a 23 y.o., female with a past medical history of Hyperthyrodidism. She has followed with [facility] since 08/02/18 for consultative assistance with management of her Hyperthyroidism.   HISTORICAL SUMMARY: Pt presented to the ED on 05/27/18 with c/o dizziness, nausea and vomiting. She was found to be tachycardic with abnormal liver enzymes. Labs revealed suppressed TSH at < 0.010 uIU/mL with elevated FT4. She was started on Methimazole at the time.   Cousin passed away with Lupus.    She is studying for [employer]   SUBJECTIVE:   During last visit (07/1818): We decreased Methimazole from 15 mg to 10 mg daily   Today (09/24/2018):  Nancy Mcconnell is here with her mother for a 6 weeks follow up on hyrperthyroidism . Pt on methimazole 5 mg daily  Weight is stable. Denies palpitations and diarrhea. .  Denies side effects from methimazole  Denies local neck symptoms  She does have a lot of eye symptoms such as burning, itching and tearing   ROS:  As per HPI.   HISTORY:  Past Medical History:  Past Medical History:  Diagnosis Date  . Medical history non-contributory   . Tachycardia 05/27/2018   Past Surgical History:  Past Surgical History:  Procedure Laterality Date  . NO PAST SURGERIES      Social History:  reports that she has never smoked. She has never used smokeless tobacco. She reports current alcohol use. She reports that she does not use drugs.  Family History: family history includes Healthy in her brother, brother, and mother; Heart failure in her father.   HOME MEDICATIONS: Allergies as of 09/24/2018   No Known Allergies     Medication List       Accurate as of September 24, 2018 12:48 PM.  Always use your most recent med list.        atenolol 25 MG tablet Commonly known as:  TENORMIN Take 1 tablet (25 mg total) by mouth daily.   etonogestrel 68 MG Impl implant Commonly known as:  [name] 1 each by Subdermal route once.   methimazole 5 MG tablet Commonly known as:  TAPAZOLE Take 1 tablet (5 mg total) by mouth daily.         OBJECTIVE:   PHYSICAL EXAM: VS: BP 112/70 (BP Location: Left Arm, Patient Position: Sitting, Cuff Size: Normal)   Pulse 99   Ht 5\' 5"  (1.651 m)   Wt 129 lb 12.8 oz (58.9 kg)   SpO2 98%   BMI 21.60 kg/m    EXAM: General: Pt appears well and is in NAD  Hydration: Well-hydrated with moist mucous membranes and good skin turgor  Eyes: External eye exam normal with stare, lid lag and  exophthalmos.  EOM intact.    Neck: General: Supple without adenopathy. Thyroid: Thyroid is enlarged ~ 60 grams .  No goiter or nodules appreciated. No thyroid bruit.  Lungs: Clear with good BS bilat with no rales, rhonchi, or wheezes  Heart: Auscultation: RRR.  Abdomen: Normoactive bowel sounds, soft, nontender, without masses or organomegaly palpable  Extremities:  BL LE: No pretibial edema normal ROM and strength.  Mental Status: Judgment, insight: Intact Orientation: Oriented to time, place, and person  Mood and affect: No depression, anxiety, or agitation     DATA REVIEWED:     ASSESSMENT / PLAN / RECOMMENDATIONS:   1) Hyperthyroidism Secondary to Graves' Disease :  - She is clinically and biochemically hyperthyroid - We discussed with pt the benefits of methimazole in the Tx of hyperthyroidism, as well as the possible side effects/complications of anti-thyroid drug Tx (specifically detailing the rare, but serious side effect of agranulocytosis). She was informed of need for regular thyroid function monitoring while on methimazole to ensure appropriate dosage without over-treatment. As well, we discussed the possible side effects of  methimazole including the chance of rash, the small chance of liver irritation/juandice and the <=1 in 300-400 chance of sudden onset agranulocytosis.  We discussed importance of going to ED promptly (and stopping methimazole) if shewere to develop significant fever with severe sore throat of other evidence of acute infection.    - I have advised her that due to her recent eye symptoms, I would avoid RAI ablation at this time as long as she is tolerating methimazole well. As RAI ablation could exacerbate her eye symptoms.     Medications  Increase Methimazole to 15 mg daily   2.) Graves' Disease:  - Pt with graves' orbitopathy, she was advised to set up an appointment with an ophthalmologist   F/U 6 weeks, labs in 3 weeks   Addendum: Left a voice message on her phone on 09/24/18 @ 1620 to increase methimazole to the above  Signed electronically by: Lyndle HerrlichAbby Jaralla Shamleffer, MD  [facility]  [facility] [address] [place], [place] [address] Phone: [phone] FAX: [phone]      CC: Sherren MochaShaw, Eva N, MD [address] [place] [address] Phone: [phone]  Fax: [phone]   Return to Endocrinology clinic as below: Future Appointments  Date Time Provider Department Center  10/20/2018  2:00 PM [facility] None  11/08/2018  3:20 PM Shamleffer, Konrad DoloresIbtehal Jaralla, MD [facility] None

## 2018-09-24 NOTE — Patient Instructions (Signed)
We recommend that you follow these hyperthyroidism instructions at home:  1) Take Methimazole 5 mg once  a day  If you develop severe sore throat with high fevers OR develop unexplained yellowing of your skin, eyes, under your tongue, severe abdominal pain with nausea or vomiting --> then please get evaluated immediately.  2) Atenolol 25 mg one times a day 3) Get repeat thyroid labs in 3 weeks.   It is ESSENTIAL to get follow-up labs to help avoid over or undertreatment of your hyperthyroidism - both of which can be dangerous to your health.

## 2018-09-27 ENCOUNTER — Telehealth: Payer: Self-pay | Admitting: Internal Medicine

## 2018-09-27 NOTE — Telephone Encounter (Signed)
Patient has called and confirmed she received your message about the dosage change.

## 2018-09-27 NOTE — Telephone Encounter (Signed)
Perfect. Thank you!

## 2018-10-08 ENCOUNTER — Other Ambulatory Visit: Payer: Self-pay

## 2018-10-08 ENCOUNTER — Telehealth: Payer: Self-pay | Admitting: Internal Medicine

## 2018-10-08 MED ORDER — METHIMAZOLE 5 MG PO TABS: 15.0000 mg | ORAL_TABLET | Freq: Every day | ORAL | 3 refills | Status: DC

## 2018-10-08 NOTE — Telephone Encounter (Signed)
Called pt and clarified. Pt stated that she needs the directions changed from take 2 tablets daily to 3 tablets daily, as she is taking 15mg  total per day per Dr. Vito Berger last office note.  This Rx was sent and pt is aware.

## 2018-10-08 NOTE — Telephone Encounter (Signed)
Patient stated methimazole (TAPAZOLE) 5 MG tablet needs to be changed from 2 mg to 3 mg. Please Advise, thanks  [id number] [place], [place] - [address]

## 2018-10-20 ENCOUNTER — Other Ambulatory Visit (INDEPENDENT_AMBULATORY_CARE_PROVIDER_SITE_OTHER): Payer: BLUE CROSS/BLUE SHIELD

## 2018-10-20 DIAGNOSIS — E0500 Thyrotoxicosis with diffuse goiter without thyrotoxic crisis or storm: Secondary | ICD-10-CM

## 2018-10-20 LAB — TSH: TSH: <0.01 u[IU]/mL — ABNORMAL LOW (ref 0.35–4.50)

## 2018-10-20 LAB — T4, FREE: Free T4: 0.65 ng/dL (ref 0.60–1.60)

## 2018-10-21 ENCOUNTER — Encounter: Payer: Self-pay | Admitting: Internal Medicine

## 2018-10-28 ENCOUNTER — Telehealth: Payer: Self-pay | Admitting: Internal Medicine

## 2018-10-28 NOTE — Telephone Encounter (Signed)
Patient called and would like an sick note for school for the last 2 weeks of the quarter.  Until 11/12/2018 , patient states that she forgot to get it when she was here last

## 2018-10-28 NOTE — Telephone Encounter (Signed)
Called and left detailed voicemail for pt with MD message. 

## 2018-10-28 NOTE — Telephone Encounter (Signed)
Please advise 

## 2018-11-08 ENCOUNTER — Encounter: Payer: Self-pay | Admitting: Internal Medicine

## 2018-11-08 ENCOUNTER — Other Ambulatory Visit: Payer: Self-pay

## 2018-11-08 ENCOUNTER — Ambulatory Visit (INDEPENDENT_AMBULATORY_CARE_PROVIDER_SITE_OTHER): Payer: BLUE CROSS/BLUE SHIELD | Admitting: Internal Medicine

## 2018-11-08 VITALS — BP 114/74 | HR 84 | Ht 65.0 in | Wt 133.2 lb

## 2018-11-08 DIAGNOSIS — E0590 Thyrotoxicosis, unspecified without thyrotoxic crisis or storm: Secondary | ICD-10-CM

## 2018-11-08 DIAGNOSIS — E0500 Thyrotoxicosis with diffuse goiter without thyrotoxic crisis or storm: Secondary | ICD-10-CM

## 2018-11-08 LAB — T4, FREE: Free T4: 0.64 ng/dL (ref 0.60–1.60)

## 2018-11-08 LAB — TSH: TSH: 0.07 u[IU]/mL — ABNORMAL LOW (ref 0.35–4.50)

## 2018-11-08 MED ORDER — METHIMAZOLE 5 MG PO TABS: 15.0000 mg | ORAL_TABLET | Freq: Every day | ORAL | 3 refills | Status: DC

## 2018-11-08 NOTE — Patient Instructions (Signed)
We recommend that you follow these hyperthyroidism instructions at home:  1) Take Methimazole 15 mg once  a day  If you develop severe sore throat with high fevers OR develop unexplained yellowing of your skin, eyes, under your tongue, severe abdominal pain with nausea or vomiting --> then please get evaluated immediately.  2) Atenolol 25 mg one times a day 3) Labs in 3 weeks

## 2018-11-08 NOTE — Progress Notes (Signed)
Name: Nancy Mcconnell  MRN/ DOB: [id number], 08-05-1996    Age/ Sex: 23 y.o., female     PCP: Sherren Mocha, MD   Reason for Endocrinology Evaluation: Hyperthyroidism     Initial Endocrinology Clinic Visit: 08/02/18    PATIENT IDENTIFIER: Nancy Mcconnell is a 23 y.o., female with a past medical history of Hyperthyrodidism. She has followed with [facility] since 08/02/18 for consultative assistance with management of her Hyperthyroidism.   HISTORICAL SUMMARY: Pt presented to the ED on 05/27/18 with c/o dizziness, nausea and vomiting. She was found to be tachycardic with abnormal liver enzymes. Labs revealed suppressed TSH at < 0.010 uIU/mL with elevated FT4. She was started on Methimazole in September 2019  An attempt to reduce her methimazole to 5 mg daily in January 2020 resulted in hyperthyroidism.  Cousin passed away with Lupus.    She is studying for [employer]   SUBJECTIVE:   During last visit (09/24/18): She was on Methimazole 10 mg daily, (reduced from 15 mg) but patient became hyperthyroid again, so we increased it back to 15 mg.   Today (11/08/2018):  Nancy Mcconnell is here with her mother for a 6 weeks follow up on hyrperthyroidism . Pt on methimazole 5 mg daily  Weight is stable. Denies palpitations and diarrhea. .  Denies side effects from methimazole  Denies local neck symptoms  She does have eye symptoms such as burning, itching and tearing  She continues with anxiety  ROS:  As per HPI.   HISTORY:  Past Medical History:  Past Medical History:  Diagnosis Date  . Medical history non-contributory   . Tachycardia 05/27/2018   Past Surgical History:  Past Surgical History:  Procedure Laterality Date  . NO PAST SURGERIES      Social History:  reports that she has never smoked. She has never used smokeless tobacco. She reports current alcohol use. She reports that she does not use drugs.  Family History: family history includes Healthy in her  brother, brother, and mother; Heart failure in her father.   HOME MEDICATIONS: Allergies as of 11/08/2018   No Known Allergies     Medication List       Accurate as of November 08, 2018  3:34 PM. Always use your most recent med list.        atenolol 25 MG tablet Commonly known as:  TENORMIN Take 1 tablet (25 mg total) by mouth daily.   etonogestrel 68 MG Impl implant Commonly known as:  [name] 1 each by Subdermal route once.   methimazole 5 MG tablet Commonly known as:  TAPAZOLE Take 3 tablets (15 mg total) by mouth daily.         OBJECTIVE:   PHYSICAL EXAM: VS: BP 114/74 (BP Location: Left Arm, Patient Position: Sitting, Cuff Size: Normal)   Pulse 84   Ht 5\' 5"  (1.651 m)   Wt 133 lb 3.2 oz (60.4 kg)   SpO2 98%   BMI 22.17 kg/m    EXAM: General: Pt appears well and is in NAD  Hydration: Well-hydrated with moist mucous membranes and good skin turgor  Eyes: External eye exam normal with stare, lid lag and  exophthalmos.  EOM intact.    Neck: General: Supple without adenopathy. Thyroid: Thyroid is enlarged ~ 60 grams .  No goiter or nodules appreciated. No thyroid bruit.  Lungs: Clear with good BS bilat with no rales, rhonchi, or wheezes  Heart: Auscultation: RRR.  Abdomen: Normoactive bowel sounds,  soft, nontender, without masses or organomegaly palpable  Extremities:  BL LE: No pretibial edema normal ROM and strength.  Mental Status: Judgment, insight: Intact Orientation: Oriented to time, place, and person Mood and affect: No depression, anxiety, or agitation     DATA REVIEWED:  Results for ADI, TETRAULT (MRN [id number]) as of 11/09/2018 12:36  Ref. Range 11/08/2018 15:50  TSH Latest Ref Range: 0.35 - 4.50 uIU/mL 0.07 (L)  T4,Free(Direct) Latest Ref Range: 0.60 - 1.60 ng/dL 6.41     ASSESSMENT / PLAN / RECOMMENDATIONS:   1) Hyperthyroidism Secondary to Graves' Disease :  - She is clinically euthyroid - We discussed with pt the benefits of  methimazole in the Tx of hyperthyroidism, as well as the possible side effects/complications of anti-thyroid drug Tx (specifically detailing the rare, but serious side effect of agranulocytosis). She was informed of need for regular thyroid function monitoring while on methimazole to ensure appropriate dosage without over-treatment. As well, we discussed the possible side effects of methimazole including the chance of rash, the small chance of liver irritation/juandice and the <=1 in 300-400 chance of sudden onset agranulocytosis.  We discussed importance of going to ED promptly (and stopping methimazole) if shewere to develop significant fever with severe sore throat of other evidence of acute infection.    - I have advised her that due to her recent eye symptoms, I would avoid RAI ablation at this time as long as she is tolerating methimazole well. As RAI ablation could exacerbate her eye symptoms.   Repeat TFT's today continue to show low TSH but normal FT4. Will not make any changes, will recheck in 3 weeks again   Medications   Methimazole to 15 mg daily   2.) Graves' Disease:  - Pt with graves' orbitopathy, she was advised to set up an appointment with an ophthalmologist 3/11  Addendum: left a message for the patient to continue same dose on 11/09/2018     Signed electronically by: Lyndle Herrlich, MD  [facility]  [facility] [address] [place] [place], [place] [address] Phone: [phone] FAX: [phone]      CC: Sherren Mocha, MD [address] [place] [address] Phone: [phone]  Fax: [phone]   Return to Endocrinology clinic as below: No future appointments.

## 2018-11-17 ENCOUNTER — Telehealth: Payer: Self-pay | Admitting: Family Medicine

## 2018-11-17 NOTE — Telephone Encounter (Signed)
Attempted to call patient regarding needing a tetanus booster. No answer, left message to call back and clarify.

## 2018-11-17 NOTE — Telephone Encounter (Signed)
Copied from CRM [id number]. Topic: Appointment Scheduling - Scheduling Inquiry for Clinic >> Nov 17, 2018 12:50 PM Richarda Blade wrote: Reason for CRM: Patient wants to schedule a Tetanus Booster Shot. Patient was informed that the office would call back to schedule an appointment.

## 2018-11-25 ENCOUNTER — Telehealth: Payer: Self-pay | Admitting: Internal Medicine

## 2018-11-25 NOTE — Telephone Encounter (Signed)
MEDICATION: methimazole (TAPAZOLE) 5 MG tablet  PHARMACY:  [id number] [place], [place] - [address]  IS THIS A 90 DAY SUPPLY :   IS PATIENT OUT OF MEDICATION: Yes  IF NOT; HOW MUCH IS LEFT:   LAST APPOINTMENT DATE: @2 /24/2020  NEXT APPOINTMENT DATE:@3 /16/2020  DO WE HAVE YOUR PERMISSION TO LEAVE A DETAILED MESSAGE:  OTHER COMMENTS:  Patient stated that Dr Lonzo Cloud changed the doasge of this medication at her last visit . She is now taking 10mg  so the patient is running out of her medication before he next refill date      **Let patient know to contact pharmacy at the end of the day to make sure medication is ready. **  ** Please notify patient to allow 48-72 hours to process**  **Encourage patient to contact the pharmacy for refills or they can request refills through [facility]**

## 2018-11-25 NOTE — Telephone Encounter (Signed)
rx sent on 11/07/2018 had 3 refills pt was informed to contact pharmacy

## 2018-11-29 ENCOUNTER — Other Ambulatory Visit: Payer: Self-pay | Admitting: Internal Medicine

## 2018-11-29 ENCOUNTER — Other Ambulatory Visit: Payer: BLUE CROSS/BLUE SHIELD

## 2018-11-29 DIAGNOSIS — E0500 Thyrotoxicosis with diffuse goiter without thyrotoxic crisis or storm: Secondary | ICD-10-CM

## 2018-11-30 ENCOUNTER — Other Ambulatory Visit: Payer: BLUE CROSS/BLUE SHIELD

## 2018-12-01 ENCOUNTER — Other Ambulatory Visit: Payer: Self-pay

## 2018-12-01 ENCOUNTER — Other Ambulatory Visit (INDEPENDENT_AMBULATORY_CARE_PROVIDER_SITE_OTHER): Payer: BLUE CROSS/BLUE SHIELD

## 2018-12-01 DIAGNOSIS — E0500 Thyrotoxicosis with diffuse goiter without thyrotoxic crisis or storm: Secondary | ICD-10-CM | POA: Diagnosis not present

## 2018-12-01 LAB — TSH: TSH: 9.18 u[IU]/mL — ABNORMAL HIGH (ref 0.35–4.50)

## 2018-12-01 LAB — T4, FREE: Free T4: 0.49 ng/dL — ABNORMAL LOW (ref 0.60–1.60)

## 2018-12-13 ENCOUNTER — Other Ambulatory Visit: Payer: Self-pay | Admitting: Physician Assistant

## 2018-12-13 NOTE — Telephone Encounter (Signed)
Requested medication (s) are due for refill today: yes  Requested medication (s) are on the active medication list: yes  Last refill:  07/16/18 #90  Future visit scheduled: No  Notes to clinic: Dr. Clelia Croft listed as PCP but last refill by Minta Balsam    Requested Prescriptions  Pending Prescriptions Disp Refills   atenolol (TENORMIN) 25 MG tablet [Pharmacy Med Name: ATENOLOL 25 MG TABLET] 90 tablet 0    Sig: TAKE 1 TABLET BY MOUTH EVERY DAY     Cardiovascular:  Beta Blockers Passed - 12/13/2018  3:49 PM      Passed - Last BP in normal range    BP Readings from Last 1 Encounters:  11/08/18 114/74         Passed - Last Heart Rate in normal range    Pulse Readings from Last 1 Encounters:  11/08/18 84         Passed - Valid encounter within last 6 months    Recent Outpatient Visits          5 months ago Hyperthyroidism   [facility] at [place], [place] D, PA-C   8 months ago Vaginal discharge   [facility] at [place], [place] D, PA-C   11 months ago Hearing loss due to cerumen impaction, right   [facility] [place], [place], PA-C   1 year ago Hordeolum externum of right upper eyelid   Primary Care at [place], [place] D, [place]   2 years ago Other fatigue   Primary Care at [place], [place] D, [place]

## 2018-12-14 NOTE — Telephone Encounter (Signed)
No further refill without office visit °

## 2019-01-03 ENCOUNTER — Ambulatory Visit (INDEPENDENT_AMBULATORY_CARE_PROVIDER_SITE_OTHER): Payer: BLUE CROSS/BLUE SHIELD | Admitting: Internal Medicine

## 2019-01-03 ENCOUNTER — Other Ambulatory Visit: Payer: Self-pay

## 2019-01-03 ENCOUNTER — Encounter: Payer: Self-pay | Admitting: Internal Medicine

## 2019-01-03 VITALS — BP 114/72 | HR 93 | Temp 98.0°F | Ht 65.0 in | Wt 135.4 lb

## 2019-01-03 DIAGNOSIS — E0500 Thyrotoxicosis with diffuse goiter without thyrotoxic crisis or storm: Secondary | ICD-10-CM | POA: Diagnosis not present

## 2019-01-03 DIAGNOSIS — E0590 Thyrotoxicosis, unspecified without thyrotoxic crisis or storm: Secondary | ICD-10-CM | POA: Diagnosis not present

## 2019-01-03 NOTE — Progress Notes (Signed)
Name: Nancy Mcconnell  MRN/ DOB: [id number], 25-May-1996    Age/ Sex: 23 y.o., female     PCP: Sherren MochaShaw, Eva N, MD   Reason for Endocrinology Evaluation: Hyperthyroidism     Initial Endocrinology Clinic Visit: 08/02/18    PATIENT IDENTIFIER: [name] is a 23 y.o., female with a past medical history of Hyperthyrodidism. She has followed with [facility] since 08/02/18 for consultative assistance with management of her Hyperthyroidism.   HISTORICAL SUMMARY: Pt presented to the ED on 05/27/18 with c/o dizziness, nausea and vomiting. She was found to be tachycardic with abnormal liver enzymes. Labs revealed suppressed TSH at < 0.010 uIU/mL with elevated FT4. She was started on Methimazole in September 2019  An attempt to reduce her methimazole to 5 mg daily in January 2020 resulted in hyperthyroidism.  Cousin passed away with Lupus.    She is studying for [employer]   SUBJECTIVE:   During last visit (11/08/18): TSH was elevated at 9.18 uIU/mL while on methimazole 15 mg daily. Held it for 3 days and restarted at 10 mg daily     Today (01/04/2019):  Nancy Mcconnell is here with her mother for  8 weeks follow up on hyperthyroidism . Pt on methimazole 10 mg daily . She did follow the instructions of holding off on methimazole for 3 days and restarting at 10 mg instead of 15. She did notice weight gain. Continues to have occasional palpitations .Denies diarrhea or tremors.  Denies side effects from methimazole  Denies local neck symptoms  She does have eye symptoms such as burning, itching and tearing , had seen ophthalmology in March, 2020 and was not thought that she has graves' orbitopathy per patient.  She continues with anxiety and insomnia.   ROS:  As per HPI.   HISTORY:  Past Medical History:  Past Medical History:  Diagnosis Date  . Medical history non-contributory   . Tachycardia 05/27/2018   Past Surgical History:  Past Surgical History:  Procedure  Laterality Date  . NO PAST SURGERIES      Social History:  reports that she has never smoked. She has never used smokeless tobacco. She reports current alcohol use. She reports that she does not use drugs.  Family History: family history includes Healthy in her brother, brother, and mother; Heart failure in her father.   HOME MEDICATIONS: Allergies as of 01/03/2019   No Known Allergies     Medication List       Accurate as of January 03, 2019 11:59 PM. Always use your most recent med list.        atenolol 25 MG tablet Commonly known as:  TENORMIN TAKE 1 TABLET BY MOUTH EVERY DAY   methimazole 5 MG tablet Commonly known as:  TAPAZOLE Take 3 tablets (15 mg total) by mouth daily.         OBJECTIVE:   PHYSICAL EXAM: VS: BP 114/72 (BP Location: Right Arm, Patient Position: Sitting, Cuff Size: Normal)   Pulse 93   Temp 98 F (36.7 C)   Ht 5\' 5"  (1.651 m)   Wt 135 lb 6.4 oz (61.4 kg)   SpO2 98%   BMI 22.53 kg/m    EXAM: General: Pt appears well and is in NAD  Hydration: Well-hydrated with moist mucous membranes and good skin turgor  Eyes: External eye exam normal with stare, lid lag and  exophthalmos.  EOM intact.    Neck: General: Supple without adenopathy. Thyroid: Thyroid is enlarged ~  60 grams .  No goiter or nodules appreciated. No thyroid bruit.  Lungs: Clear with good BS bilat with no rales, rhonchi, or wheezes  Heart: Auscultation: RRR.  Abdomen: Normoactive bowel sounds, soft, nontender, without masses or organomegaly palpable  Extremities:  BL LE: No pretibial edema normal ROM and strength.  Mental Status: Judgment, insight: Intact Orientation: Oriented to time, place, and person Mood and affect: No depression, anxiety, or agitation     DATA REVIEWED:  Results for MARQUELLA, RAMDEEN (MRN [id number]) as of 01/03/2019 15:58  Ref. Range 11/08/2018 15:50 12/01/2018 15:45  TSH Latest Ref Range: 0.35 - 4.50 uIU/mL 0.07 (L) 9.18 (H)  T4,Free(Direct) Latest Ref  Range: 0.60 - 1.60 ng/dL 1.50 5.69 (L)    ASSESSMENT / PLAN / RECOMMENDATIONS:   1) Hyperthyroidism Secondary to Graves' Disease :  - She is clinically euthyroid - She assures me compliance with methimazole.  - She denies any local neck symptoms. -  Nancy Mcconnell continued to have elevated TSH , will reduce methimazole as below   Medications  Decrease  Methimazole to 5 mg daily   2.) Graves' Disease:  - Pt with mild graves' orbitopathy, was seen by ophthalmology in March, 2020   F/u in 3 months  Labs in 4 weeks    Signed electronically by: Lyndle Herrlich, MD  [facility]  [facility] [address] [place] [place], [place] [address] Phone: [phone] FAX: [phone]      CC: Sherren Mocha, MD [address] [place] [address] Phone: [phone]  Fax: [phone]   Return to Endocrinology clinic as below: No future appointments.

## 2019-01-03 NOTE — Patient Instructions (Signed)
-   Continue Methimazole 10 Mg daily  - Call our office to schedule an appointment to see me in 3 months and labs in 4 weeks

## 2019-01-04 LAB — T4, FREE: Free T4: 0.61 ng/dL (ref 0.60–1.60)

## 2019-01-04 LAB — TSH: TSH: 8.05 u[IU]/mL — ABNORMAL HIGH (ref 0.35–4.50)

## 2019-01-04 MED ORDER — METHIMAZOLE 5 MG PO TABS: 5.0000 mg | ORAL_TABLET | Freq: Every day | ORAL | 6 refills | Status: DC

## 2019-01-31 ENCOUNTER — Other Ambulatory Visit (INDEPENDENT_AMBULATORY_CARE_PROVIDER_SITE_OTHER): Payer: BLUE CROSS/BLUE SHIELD

## 2019-01-31 ENCOUNTER — Other Ambulatory Visit: Payer: Self-pay

## 2019-01-31 DIAGNOSIS — R112 Nausea with vomiting, unspecified: Secondary | ICD-10-CM

## 2019-01-31 DIAGNOSIS — E0500 Thyrotoxicosis with diffuse goiter without thyrotoxic crisis or storm: Secondary | ICD-10-CM

## 2019-01-31 LAB — TSH: TSH: 0.06 u[IU]/mL — ABNORMAL LOW (ref 0.35–4.50)

## 2019-02-01 ENCOUNTER — Other Ambulatory Visit: Payer: Self-pay | Admitting: Internal Medicine

## 2019-02-01 ENCOUNTER — Encounter: Payer: Self-pay | Admitting: Internal Medicine

## 2019-02-01 DIAGNOSIS — R112 Nausea with vomiting, unspecified: Secondary | ICD-10-CM

## 2019-02-01 LAB — COMPREHENSIVE METABOLIC PANEL
ALT: 7 U/L (ref 0–35)
AST: 15 U/L (ref 0–37)
Albumin: 4.3 g/dL (ref 3.5–5.2)
Alkaline Phosphatase: 66 U/L (ref 39–117)
BUN: 6 mg/dL (ref 6–23)
CO2: 26 mEq/L (ref 19–32)
Calcium: 9 mg/dL (ref 8.4–10.5)
Chloride: 104 mEq/L (ref 96–112)
Creatinine, Ser: 0.6 mg/dL (ref 0.40–1.20)
GFR: 149.16 mL/min (ref >60.00)
Glucose, Bld: 75 mg/dL (ref 70–99)
Potassium: 3.5 mEq/L (ref 3.5–5.1)
Sodium: 138 mEq/L (ref 135–145)
Total Bilirubin: 0.3 mg/dL (ref 0.2–1.2)
Total Protein: 7 g/dL (ref 6.0–8.3)

## 2019-02-01 LAB — T4, FREE: Free T4: 1.11 ng/dL (ref 0.60–1.60)

## 2019-02-01 MED ORDER — METHIMAZOLE 5 MG PO TABS: 7.5000 mg | ORAL_TABLET | Freq: Every day | ORAL | 6 refills | Status: DC

## 2019-02-01 NOTE — Progress Notes (Signed)
Will increase Methimazole to 7.5 mg daily    Abby Raelyn Mora, MD  [facility] Endocrinology  [facility] [address] [place], [place] [address] Phone: [phone] FAX: [phone]

## 2019-02-01 NOTE — Addendum Note (Signed)
Addended by: Adline Mango I on: 02/01/2019 03:22 PM   Modules accepted: Orders

## 2019-03-13 ENCOUNTER — Other Ambulatory Visit: Payer: Self-pay | Admitting: Family Medicine

## 2019-03-13 NOTE — Telephone Encounter (Signed)
Requested Prescriptions  Pending Prescriptions Disp Refills  . atenolol (TENORMIN) 25 MG tablet [Pharmacy Med Name: ATENOLOL 25 MG TABLET] 90 tablet 0    Sig: TAKE 1 TABLET BY MOUTH EVERY DAY     Cardiovascular:  Beta Blockers Failed - 03/13/2019 12:47 AM      Failed - Valid encounter within last 6 months    Recent Outpatient Visits          8 months ago Hyperthyroidism   [facility] Care at [place], [place] D, PA-C   11 months ago Vaginal discharge   [facility] at [place], [place] D, PA-C   1 year ago Hearing loss due to cerumen impaction, right   Primary Care at [place], PA-C   1 year ago Hordeolum externum of right upper eyelid   Primary Care at [place], [place], [place]   2 years ago Other fatigue   Primary Care at [place], [place], [place] BP in normal range    BP Readings from Last 1 Encounters:  01/03/19 114/72         Passed - Last Heart Rate in normal range    Pulse Readings from Last 1 Encounters:  01/03/19 93

## 2019-04-04 ENCOUNTER — Other Ambulatory Visit: Payer: Self-pay

## 2019-04-04 ENCOUNTER — Ambulatory Visit (INDEPENDENT_AMBULATORY_CARE_PROVIDER_SITE_OTHER): Payer: BLUE CROSS/BLUE SHIELD | Admitting: Internal Medicine

## 2019-04-04 VITALS — BP 104/64 | HR 77 | Temp 98.6°F | Ht 65.0 in | Wt 139.8 lb

## 2019-04-04 DIAGNOSIS — E0590 Thyrotoxicosis, unspecified without thyrotoxic crisis or storm: Secondary | ICD-10-CM

## 2019-04-04 DIAGNOSIS — E0500 Thyrotoxicosis with diffuse goiter without thyrotoxic crisis or storm: Secondary | ICD-10-CM

## 2019-04-04 NOTE — Patient Instructions (Signed)
-   Continue Methimazole 7.5 Mg daily ( One and half tablet )

## 2019-04-04 NOTE — Progress Notes (Signed)
Name: Nancy Mcconnell  MRN/ DOB: [id number], 1996/02/20    Age/ Sex: 23 y.o., female     PCP: Shawnee Knapp, MD   Reason for Endocrinology Evaluation: Hyperthyroidism     Initial Endocrinology Clinic Visit: 08/02/18    PATIENT IDENTIFIER: Nancy Mcconnell is a 23 y.o., female with a past medical history of Hyperthyrodidism. She has followed with [facility] since 08/02/18 for consultative assistance with management of her Hyperthyroidism.   HISTORICAL SUMMARY: Pt presented to the ED on 05/27/18 with c/o dizziness, nausea and vomiting. She was found to be tachycardic with abnormal liver enzymes. Labs revealed suppressed TSH at < 0.010 uIU/mL with elevated FT4. She was started on Methimazole in September 2019  An attempt to reduce her methimazole to 5 mg daily in January 2020 resulted in hyperthyroidism.  Cousin passed away with Lupus.    She is studying for [employer]   SUBJECTIVE:   During last visit (11/08/18): TSH was elevated at 9.18 uIU/mL while on methimazole 15 mg daily. Held it for 3 days and restarted at 10 mg daily     Today (04/04/2019):  Nancy Mcconnell is here for 8 weeks follow up on hyperthyroidism . Pt on methimazole 7.5 mg daily . She denies any side effects.   She denies any weight loss, palpitations, or diarrhea.  She has occasional anxiety and difficult sleeping at times.   She does have eye symptoms such as burning, itching and tearing , had seen ophthalmology in March, 2020 and was not thought that she has graves' orbitopathy per patient.   Denies any local neck symptoms.    ROS:  As per HPI.   HISTORY:  Past Medical History:  Past Medical History:  Diagnosis Date  . Medical history non-contributory   . Tachycardia 05/27/2018   Past Surgical History:  Past Surgical History:  Procedure Laterality Date  . NO PAST SURGERIES      Social History:  reports that she has never smoked. She has never used smokeless tobacco. She reports  current alcohol use. She reports that she does not use drugs.  Family History: family history includes Healthy in her brother, brother, and mother; Heart failure in her father.   HOME MEDICATIONS: Allergies as of 04/04/2019   No Known Allergies     Medication List       Accurate as of April 04, 2019  4:18 PM. If you have any questions, ask your nurse or doctor.        atenolol 25 MG tablet Commonly known as: TENORMIN TAKE 1 TABLET BY MOUTH EVERY DAY   methimazole 5 MG tablet Commonly known as: TAPAZOLE Take 1.5 tablets (7.5 mg total) by mouth daily.         OBJECTIVE:   PHYSICAL EXAM: VS: BP 104/64 (BP Location: Left Arm, Patient Position: Sitting, Cuff Size: Normal)   Pulse 77   Temp 98.6 F (37 C)   Ht 5\' 5"  (1.651 m)   Wt 139 lb 12.8 oz (63.4 kg)   SpO2 98%   BMI 23.26 kg/m    EXAM: General: Pt appears well and is in NAD  Hydration: Well-hydrated with moist mucous membranes and good skin turgor  Eyes: External eye exam normal with stare, lid lag and  exophthalmos.  EOM intact.    Neck: General: Supple without adenopathy. Thyroid: Thyroid is prominent .  No goiter or nodules appreciated. No thyroid bruit.  Lungs: Clear with good BS bilat with no rales, rhonchi,  or wheezes  Heart: Auscultation: RRR.  Abdomen: Normoactive bowel sounds, soft, nontender, without masses or organomegaly palpable  Extremities:  BL LE: No pretibial edema normal ROM and strength.  Mental Status: Judgment, insight: Intact Orientation: Oriented to time, place, and person Mood and affect: No depression, anxiety, or agitation     DATA REVIEWED: Results for Nancy Mcconnell, Nancy Mcconnell (MRN [id number]) as of 04/05/2019 13:39  Ref. Range 01/31/2019 15:25 02/01/2019 15:22 04/04/2019 16:25  TSH Latest Ref Range: 0.35 - 4.50 uIU/mL 0.06 (L)  0.11 (L)  T4,Free(Direct) Latest Ref Range: 0.60 - 1.60 ng/dL 4.091.11  8.111.02    ASSESSMENT / PLAN / RECOMMENDATIONS:   1) Hyperthyroidism Secondary to Graves'  Disease :  - She is clinically euthyroid - She denies any local neck symptoms. -  TSH is improving, will continue current dose of methimazole   Medications    Methimazole to 7.5 mg daily   2.) Graves' Disease:  - Pt with mild graves' orbitopathy, was seen by ophthalmology in March, 2020 - She can use artifical drops and selenium    F/u in 3 months  Labs in 6 weeks    Signed electronically by: Lyndle HerrlichAbby Jaralla Shamleffer, MD  [facility]  [address] [address] [place], [place] [address] Phone: [phone] FAX: [phone]      CC: Sherren MochaShaw, Eva N, MD No address on file Phone: None  Fax: None   Return to Endocrinology clinic as below: No future appointments.

## 2019-04-05 ENCOUNTER — Encounter: Payer: Self-pay | Admitting: Internal Medicine

## 2019-04-05 LAB — TSH: TSH: 0.11 u[IU]/mL — ABNORMAL LOW (ref 0.35–4.50)

## 2019-04-05 LAB — T4, FREE: Free T4: 1.02 ng/dL (ref 0.60–1.60)

## 2019-05-06 ENCOUNTER — Ambulatory Visit (INDEPENDENT_AMBULATORY_CARE_PROVIDER_SITE_OTHER): Payer: BLUE CROSS/BLUE SHIELD | Admitting: Family Medicine

## 2019-05-06 ENCOUNTER — Encounter: Payer: Self-pay | Admitting: Family Medicine

## 2019-05-06 ENCOUNTER — Other Ambulatory Visit: Payer: Self-pay

## 2019-05-06 VITALS — BP 101/76 | HR 67 | Ht 64.0 in | Wt 143.0 lb

## 2019-05-06 DIAGNOSIS — Z01419 Encounter for gynecological examination (general) (routine) without abnormal findings: Secondary | ICD-10-CM | POA: Diagnosis not present

## 2019-05-06 DIAGNOSIS — Z113 Encounter for screening for infections with a predominantly sexual mode of transmission: Secondary | ICD-10-CM

## 2019-05-06 DIAGNOSIS — Z124 Encounter for screening for malignant neoplasm of cervix: Secondary | ICD-10-CM

## 2019-05-06 DIAGNOSIS — R87612 Low grade squamous intraepithelial lesion on cytologic smear of cervix (LGSIL): Secondary | ICD-10-CM

## 2019-05-06 DIAGNOSIS — Z30017 Encounter for initial prescription of implantable subdermal contraceptive: Secondary | ICD-10-CM

## 2019-05-06 DIAGNOSIS — Z3046 Encounter for surveillance of implantable subdermal contraceptive: Secondary | ICD-10-CM | POA: Diagnosis not present

## 2019-05-06 MED ORDER — ETONOGESTREL 68 MG ~~LOC~~ IMPL
68.0000 mg | DRUG_IMPLANT | Freq: Once | SUBCUTANEOUS | Status: AC
  Administered 2019-05-06: 68 mg via SUBCUTANEOUS

## 2019-05-06 NOTE — Patient Instructions (Signed)
[name] Instructions After Insertion  Keep bandage clean and dry for 24 hours  May use ice/Tylenol/Ibuprofen for soreness or pain  If you develop fever, drainage or increased warmth from incision site-contact office immediately   

## 2019-05-06 NOTE — Progress Notes (Signed)
GYNECOLOGY ANNUAL PREVENTATIVE CARE ENCOUNTER NOTE  Subjective:   Nancy Mcconnell is a 23 y.o. No obstetric history on file. female here for a routine annual gynecologic exam.  Current complaints: none.   Denies abnormal vaginal bleeding, discharge, pelvic pain, problems with intercourse or other gynecologic concerns.    Gynecologic History Patient's last menstrual period was 04/23/2019. Patient is sexually active  Contraception: [name] Last Pap: 2019. Results were: LSIL Last mammogram: n/a.  Obstetric History OB History  No obstetric history on file.    Past Medical History:  Diagnosis Date  . Medical history non-contributory   . Tachycardia 05/27/2018    Past Surgical History:  Procedure Laterality Date  . NO PAST SURGERIES      Current Outpatient Medications on File Prior to Visit  Medication Sig Dispense Refill  . atenolol (TENORMIN) 25 MG tablet TAKE 1 TABLET BY MOUTH EVERY DAY 30 tablet 0  . methimazole (TAPAZOLE) 5 MG tablet Take 1.5 tablets (7.5 mg total) by mouth daily. 60 tablet 6   Current Facility-Administered Medications on File Prior to Visit  Medication Dose Route Frequency Provider Last Rate Last Dose  . etonogestrel ([name]) implant 68 mg  68 mg Subdermal Once English, Stephanie D, PA        No Known Allergies  Social History   Socioeconomic History  . Marital status: Single    Spouse name: Not on file  . Number of children: 0  . Years of education: Not on file  . Highest education level: Not on file  Occupational History  . Not on file  Social Needs  . Financial resource strain: Not on file  . Food insecurity    Worry: Not on file    Inability: Not on file  . Transportation needs    Medical: Not on file    Non-medical: Not on file  Tobacco Use  . Smoking status: Never Smoker  . Smokeless tobacco: Never Used  Substance and Sexual Activity  . Alcohol use: Yes    Comment: occ  . Drug use: No  . Sexual activity: Yes    Birth  control/protection: Implant  Lifestyle  . Physical activity    Days per week: Not on file    Minutes per session: Not on file  . Stress: Not on file  Relationships  . Social Herbalist on phone: Not on file    Gets together: Not on file    Attends religious service: Not on file    Active member of club or organization: Not on file    Attends meetings of clubs or organizations: Not on file    Relationship status: Not on file  . Intimate partner violence    Fear of current or ex partner: Not on file    Emotionally abused: Not on file    Physically abused: Not on file    Forced sexual activity: Not on file  Other Topics Concern  . Not on file  Social History Narrative   ** Merged History Encounter **        Family History  Problem Relation Age of Onset  . Healthy Mother   . Heart failure Father   . Healthy Brother   . Healthy Brother     The following portions of the patient's history were reviewed and updated as appropriate: allergies, current medications, past family history, past medical history, past social history, past surgical history and problem list.  Review of Systems Pertinent items  are noted in HPI.   Objective:  BP 101/76   Pulse 67   Ht 5\' 4"  (1.626 m)   Wt 143 lb (64.9 kg)   LMP 04/23/2019   BMI 24.55 kg/m  Wt Readings from Last 3 Encounters:  05/06/19 143 lb (64.9 kg)  04/04/19 139 lb 12.8 oz (63.4 kg)  01/03/19 135 lb 6.4 oz (61.4 kg)     CONSTITUTIONAL: Well-developed, well-nourished female in no acute distress.  HENT:  Normocephalic, atraumatic, External right and left ear normal. Oropharynx is clear and moist EYES: Conjunctivae and EOM are normal. Pupils are equal, round, and reactive to light. No scleral icterus.  NECK: Normal range of motion, supple, no masses.  Normal thyroid.   CARDIOVASCULAR: Normal heart rate noted, regular rhythm RESPIRATORY: Clear to auscultation bilaterally. Effort and breath sounds normal, no problems  with respiration noted. BREASTS: deferred exam ABDOMEN: Soft, normal bowel sounds, no distention noted.  No tenderness, rebound or guarding.  PELVIC: Normal appearing external genitalia; normal appearing vaginal mucosa and cervix.  No abnormal discharge noted.  Normal uterine size, no other palpable masses, no uterine or adnexal tenderness. MUSCULOSKELETAL: Normal range of motion. No tenderness.  No cyanosis, clubbing, or edema.  2+ distal pulses. SKIN: Skin is warm and dry. No rash noted. Not diaphoretic. No erythema. No pallor. NEUROLOGIC: Alert and oriented to person, place, and time. Normal reflexes, muscle tone coordination. No cranial nerve deficit noted. PSYCHIATRIC: Normal mood and affect. Normal behavior. Normal judgment and thought content.   [name] Removal with reinsertion:  Patient given informed consent for removal of her [name], time out was performed.  Signed copy in the chart.  Appropriate time out taken. [name] site identified.  Area prepped in usual sterile fashon. One cc of 1% lidocaine was used to anesthetize the area at the distal end of the implant. A small stab incision was made right beside the implant on the distal portion.  The implanon rod was grasped using hemostats and removed without difficulty.  There was less than 3 cc blood loss. The new [name] removed form packaging,  Device confirmed in needle, then inserted full length of needle and withdrawn per handbook instructions.  Device palpated by physician and patient.  Pt insertion site covered with pressure dressing.   Minimal blood loss.  Pt tolerated the procedure well.      Assessment:  Annual gynecologic examination with pap smear   Plan:  1. Well Woman Exam Will follow up results of pap smear and manage accordingly. STD testing discussed. Patient requested vaginal testing  2. [name] removal and reinsertion  3. LSIL PAP today  Routine preventative health maintenance measures emphasized.  Please refer to After Visit Summary for other counseling recommendations.    Candelaria CelesteJacob Airi Copado, DO [facility]

## 2019-05-11 LAB — CYTOLOGY - PAP
Chlamydia: NEGATIVE
Neisseria Gonorrhea: NEGATIVE

## 2019-05-13 ENCOUNTER — Other Ambulatory Visit: Payer: Self-pay | Admitting: Internal Medicine

## 2019-05-13 ENCOUNTER — Other Ambulatory Visit: Payer: Self-pay

## 2019-05-13 ENCOUNTER — Other Ambulatory Visit (INDEPENDENT_AMBULATORY_CARE_PROVIDER_SITE_OTHER): Payer: BLUE CROSS/BLUE SHIELD

## 2019-05-13 DIAGNOSIS — E0500 Thyrotoxicosis with diffuse goiter without thyrotoxic crisis or storm: Secondary | ICD-10-CM

## 2019-05-14 LAB — TSH: TSH: 0.18 mIU/L — ABNORMAL LOW

## 2019-05-14 LAB — T4, FREE: Free T4: 1.5 ng/dL (ref 0.8–1.8)

## 2019-05-16 MED ORDER — METHIMAZOLE 5 MG PO TABS: 10.0000 mg | ORAL_TABLET | Freq: Every day | ORAL | 6 refills | Status: DC

## 2019-06-28 ENCOUNTER — Telehealth: Payer: Self-pay | Admitting: Family Medicine

## 2019-06-28 ENCOUNTER — Other Ambulatory Visit: Payer: Self-pay | Admitting: Family Medicine

## 2019-06-28 ENCOUNTER — Encounter: Payer: Self-pay | Admitting: Physician Assistant

## 2019-06-28 ENCOUNTER — Telehealth: Payer: Self-pay

## 2019-06-28 ENCOUNTER — Encounter: Payer: Self-pay | Admitting: Internal Medicine

## 2019-06-28 NOTE — Telephone Encounter (Signed)
Pt instructed to contact PCP for this medication

## 2019-06-28 NOTE — Telephone Encounter (Signed)
Medication Refill - Medication: atenolol (TENORMIN) 25 MG tablet [[id number]]  Pt would like to if she can get 30 pills at a time instead of 10?   Has the patient contacted their pharmacy? No. (Agent: If no, request that the patient contact the pharmacy for the refill.) (Agent: If yes, when and what did the pharmacy advise?)  Preferred Pharmacy (with phone number or street name):  [id number] - [place], [place] [address] [phone] (Phone)     Agent: Please be advised that RX refills may take up to 3 business days. We ask that you follow-up with your pharmacy.

## 2019-06-28 NOTE — Telephone Encounter (Signed)
Request refill for atenolol 25 mg  Prescribed by a historical provider and patient needs an appointment.

## 2019-06-28 NOTE — Telephone Encounter (Signed)
Patient called in needing a refill on her atenolol (TENORMIN) 25 MG tablet. pharmacy has been trying to contact for refill  Patient is requesting a 30 day supply  Requesting if it could be refilled today she will be out after today   Please advise

## 2019-06-29 ENCOUNTER — Other Ambulatory Visit: Payer: Self-pay | Admitting: *Deleted

## 2019-06-29 ENCOUNTER — Other Ambulatory Visit: Payer: Self-pay

## 2019-06-29 ENCOUNTER — Ambulatory Visit (INDEPENDENT_AMBULATORY_CARE_PROVIDER_SITE_OTHER): Payer: BLUE CROSS/BLUE SHIELD | Admitting: Internal Medicine

## 2019-06-29 VITALS — BP 98/62 | HR 100 | Temp 98.6°F | Ht 64.0 in | Wt 145.2 lb

## 2019-06-29 DIAGNOSIS — E0590 Thyrotoxicosis, unspecified without thyrotoxic crisis or storm: Secondary | ICD-10-CM | POA: Diagnosis not present

## 2019-06-29 DIAGNOSIS — E0500 Thyrotoxicosis with diffuse goiter without thyrotoxic crisis or storm: Secondary | ICD-10-CM

## 2019-06-29 MED ORDER — ATENOLOL 25 MG PO TABS: 25.0000 mg | ORAL_TABLET | Freq: Every day | ORAL | 6 refills | Status: DC

## 2019-06-29 MED ORDER — ATENOLOL 25 MG PO TABS: 25.0000 mg | ORAL_TABLET | Freq: Every day | ORAL | 0 refills | Status: DC

## 2019-06-29 MED ORDER — METHIMAZOLE 5 MG PO TABS: 10.0000 mg | ORAL_TABLET | Freq: Every day | ORAL | 6 refills | Status: DC

## 2019-06-29 NOTE — Telephone Encounter (Signed)
Provider has sent a 30 rx to pharmacy and pt needs to make an appointment for further refills. LVM to inform pt that rx has been sent.

## 2019-06-29 NOTE — Progress Notes (Signed)
Name: Nancy Mcconnell  MRN/ DOB: [id number], May 01, 1996    Age/ Sex: 23 y.o., female     PCP: Shawnee Knapp, MD   Reason for Endocrinology Evaluation: Hyperthyroidism     Initial Endocrinology Clinic Visit: 08/02/18    PATIENT IDENTIFIER: Nancy Mcconnell is a 23 y.o., female with a past medical history of Hyperthyrodidism. She has followed with [facility] since 08/02/18 for consultative assistance with management of her Hyperthyroidism.   HISTORICAL SUMMARY: Pt presented to the ED on 05/27/18 with c/o dizziness, nausea and vomiting. She was found to be tachycardic with abnormal liver enzymes. Labs revealed suppressed TSH at < 0.010 uIU/mL with elevated FT4. She was started on Methimazole in September 2019  An attempt to reduce her methimazole to 5 mg daily in January 2020 resulted in hyperthyroidism.  Cousin passed away with Lupus.    She is studying for [employer]   SUBJECTIVE:   During last visit (04/04/19): TSH was elevated at 9.18 uIU/mL while on methimazole 15 mg daily. Held it for 3 days and restarted at 10 mg daily    Today (06/30/2019):  Nancy Mcconnell is here for 3 month follow up on hyperthyroidism . Pt on methimazole 10 mg daily . She denies any side effects.  She has gained weight , denies diarrhea or palpitations   She has occasional anxiety and difficult sleeping at times.   Last  Ophthalmology exam was  in March, 2020 and was not thought that she has graves' orbitopathy per patient.   Denies any local neck symptoms.    ROS:  As per HPI.   HISTORY:  Past Medical History:  Past Medical History:  Diagnosis Date  . Medical history non-contributory   . Tachycardia 05/27/2018   Past Surgical History:  Past Surgical History:  Procedure Laterality Date  . NO PAST SURGERIES      Social History:  reports that she has never smoked. She has never used smokeless tobacco. She reports current alcohol use. She reports that she does not use drugs.   Family History: family history includes Healthy in her brother, brother, and mother; Heart failure in her father.   HOME MEDICATIONS: Allergies as of 06/29/2019   No Known Allergies     Medication List       Accurate as of June 29, 2019 11:59 PM. If you have any questions, ask your nurse or doctor.        atenolol 25 MG tablet Commonly known as: TENORMIN Take 1 tablet (25 mg total) by mouth daily.   methimazole 5 MG tablet Commonly known as: TAPAZOLE Take 2 tablets (10 mg total) by mouth daily.         OBJECTIVE:   PHYSICAL EXAM: VS: BP 98/62 (BP Location: Left Arm, Patient Position: Sitting, Cuff Size: Normal)   Pulse 100   Temp 98.6 F (37 C)   Ht 5\' 4"  (1.626 m)   Wt 145 lb 3.2 oz (65.9 kg)   SpO2 99%   BMI 24.92 kg/m    EXAM: General: Pt appears well and is in NAD  Eyes: External eye exam normal with stare, lid lag and  exophthalmos.  EOM intact.    Neck: General: Supple without adenopathy. Thyroid: Thyroid is prominent .  No goiter or nodules appreciated. No thyroid bruit.  Lungs: Clear with good BS bilat with no rales, rhonchi, or wheezes  Heart: Auscultation: RRR.  Abdomen: Normoactive bowel sounds, soft, nontender, without masses or organomegaly palpable  Extremities:  BL LE: No pretibial edema normal ROM and strength.  Mental Status: Judgment, insight: Intact Orientation: Oriented to time, place, and person Mood and affect: No depression, anxiety, or agitation     DATA REVIEWED:  Results for Nancy, Mcconnell (MRN [id number]) as of 07/01/2019 09:16  Ref. Range 06/29/2019 16:31  TSH Latest Ref Range: 0.35 - 4.50 uIU/mL 2.41  T4,Free(Direct) Latest Ref Range: 0.60 - 1.60 ng/dL 1.06    ASSESSMENT / PLAN / RECOMMENDATIONS:   1) Hyperthyroidism Secondary to Graves' Disease :  - She is clinically euthyroid - She denies any local neck symptoms. - TSH is normal , will continue current dose of methimazole   Medications    Methimazole  10 mg  daily   2.) Graves' Disease:  - Pt with mild graves' orbitopathy, was seen by ophthalmology in March, 2020    F/u in 3 months  Labs in 6 weeks    Signed electronically by: Lyndle Herrlich, MD  [facility]  [facility] [address] [place] [place], [place] [address] Phone: [phone] FAX: [phone]      CC: Sherren Mocha, MD No address on file Phone: None  Fax: None   Return to Endocrinology clinic as below: Future Appointments  Date Time Provider Department Center  08/03/2019  3:45 PM [facility] None  10/05/2019  3:40 PM Linken Mcglothen, Konrad Dolores, MD [facility] None

## 2019-06-29 NOTE — Patient Instructions (Signed)
-   Continue Methimazole 5 Mg TWO daily

## 2019-06-30 LAB — T4, FREE: Free T4: 0.93 ng/dL (ref 0.60–1.60)

## 2019-06-30 LAB — TSH: TSH: 2.41 u[IU]/mL (ref 0.35–4.50)

## 2019-07-01 ENCOUNTER — Encounter: Payer: Self-pay | Admitting: Internal Medicine

## 2019-08-03 ENCOUNTER — Other Ambulatory Visit: Payer: Self-pay

## 2019-08-03 ENCOUNTER — Other Ambulatory Visit (INDEPENDENT_AMBULATORY_CARE_PROVIDER_SITE_OTHER): Payer: BLUE CROSS/BLUE SHIELD

## 2019-08-03 DIAGNOSIS — E0500 Thyrotoxicosis with diffuse goiter without thyrotoxic crisis or storm: Secondary | ICD-10-CM | POA: Diagnosis not present

## 2019-08-04 LAB — T4, FREE: Free T4: 1 ng/dL (ref 0.60–1.60)

## 2019-08-04 LAB — TSH: TSH: 2.79 u[IU]/mL (ref 0.35–4.50)

## 2019-09-02 ENCOUNTER — Ambulatory Visit: Payer: BLUE CROSS/BLUE SHIELD | Admitting: Adult Health Nurse Practitioner

## 2019-09-02 ENCOUNTER — Encounter: Payer: Self-pay | Admitting: Adult Health Nurse Practitioner

## 2019-09-02 ENCOUNTER — Other Ambulatory Visit: Payer: Self-pay

## 2019-09-02 VITALS — BP 98/60 | HR 95 | Temp 98.3°F | Ht 65.0 in | Wt 143.6 lb

## 2019-09-02 DIAGNOSIS — Z23 Encounter for immunization: Secondary | ICD-10-CM

## 2019-09-02 DIAGNOSIS — H6123 Impacted cerumen, bilateral: Secondary | ICD-10-CM

## 2019-09-02 NOTE — Patient Instructions (Signed)
° ° ° °  If you have lab work done today you will be contacted with your lab results within the next 2 weeks.  If you have not heard from us then please contact us. The fastest way to get your results is to register for My Chart. ° ° °IF you received an x-ray today, you will receive an invoice from [facility]. Please contact [facility] at [phone] with questions or concerns regarding your invoice.  ° °IF you received labwork today, you will receive an invoice from [facility]. Please contact [facility] at [phone] with questions or concerns regarding your invoice.  ° °Our billing staff will not be able to assist you with questions regarding bills from these companies. ° °You will be contacted with the lab results as soon as they are available. The fastest way to get your results is to activate your My Chart account. Instructions are located on the last page of this paperwork. If you have not heard from us regarding the results in 2 weeks, please contact this office. °  ° ° ° °

## 2019-09-28 ENCOUNTER — Telehealth (INDEPENDENT_AMBULATORY_CARE_PROVIDER_SITE_OTHER): Payer: BLUE CROSS/BLUE SHIELD | Admitting: Adult Health Nurse Practitioner

## 2019-09-28 ENCOUNTER — Other Ambulatory Visit: Payer: Self-pay

## 2019-09-28 DIAGNOSIS — E0500 Thyrotoxicosis with diffuse goiter without thyrotoxic crisis or storm: Secondary | ICD-10-CM | POA: Diagnosis not present

## 2019-09-28 DIAGNOSIS — E0590 Thyrotoxicosis, unspecified without thyrotoxic crisis or storm: Secondary | ICD-10-CM | POA: Diagnosis not present

## 2019-09-28 DIAGNOSIS — F5101 Primary insomnia: Secondary | ICD-10-CM | POA: Diagnosis not present

## 2019-09-28 MED ORDER — TRAZODONE HCL 50 MG PO TABS: 25.0000 mg | ORAL_TABLET | Freq: Every evening | ORAL | 3 refills | Status: DC | PRN

## 2019-09-28 NOTE — Patient Instructions (Signed)
° ° ° °  If you have lab work done today you will be contacted with your lab results within the next 2 weeks.  If you have not heard from us then please contact us. The fastest way to get your results is to register for My Chart. ° ° °IF you received an x-ray today, you will receive an invoice from [facility]. Please contact [facility] at [phone] with questions or concerns regarding your invoice.  ° °IF you received labwork today, you will receive an invoice from [facility]. Please contact [facility] at [phone] with questions or concerns regarding your invoice.  ° °Our billing staff will not be able to assist you with questions regarding bills from these companies. ° °You will be contacted with the lab results as soon as they are available. The fastest way to get your results is to activate your My Chart account. Instructions are located on the last page of this paperwork. If you have not heard from us regarding the results in 2 weeks, please contact this office. °  ° ° ° °

## 2019-09-29 ENCOUNTER — Encounter: Payer: Self-pay | Admitting: Adult Health Nurse Practitioner

## 2019-09-29 DIAGNOSIS — G4700 Insomnia, unspecified: Secondary | ICD-10-CM

## 2019-09-29 HISTORY — DX: Insomnia, unspecified: G47.00

## 2019-09-29 NOTE — Progress Notes (Signed)
Telemedicine Encounter- SOAP NOTE Established Patient  This telephone encounter was conducted with the patient's (or proxy's) verbal consent via audio telecommunications: yes/no: Yes Patient was instructed to have this encounter in a suitably private space; and to only have persons present to whom they give permission to participate. In addition, patient identity was confirmed by use of name plus two identifiers (DOB and address).  I discussed the limitations, risks, security and privacy concerns of performing an evaluation and management service by telephone and the availability of in person appointments. I also discussed with the patient that there may be a patient responsible charge related to this service. The patient expressed understanding and agreed to proceed.  I spent a total of TIME; 0 MIN TO 60 MIN: 10 minutes talking with the patient or their proxy.  Chief Complaint  Patient presents with  . Insomnia    Pt stated that she has been having trouble sleeping for the past 2 wks.. Pt stated that she has graves disease.    Subjective   Nancy Mcconnell is a 24 y.o. established patient. Telephone visit today for insomnia  HPI Patient presents with 2 weeks of insomnia.  She does not feel anxious or stressed.  She has not had any events in her life which is new and/or stressful.  She states that she has been up at times until 8 in the morning and has not gotten any sleep.  She is currently in business school.  She has not tried anything over-the-counter but would like to try melatonin initially with some backup for prescription.  She does note that she has had some insomnia with her diagnosis of Graves' disease but it has not occurred in years.  Patient Active Problem List   Diagnosis Date Noted  . Insomnia 09/29/2019  . Graves disease 09/24/2018  . Tachycardia 05/27/2018  . Hyperthyroidism 05/27/2018  . Abnormal liver function 05/27/2018  . LGSIL on Pap smear of cervix 03/12/2018   . Right knee pain 02/02/2016    Past Medical History:  Diagnosis Date  . Insomnia 09/29/2019  . Medical history non-contributory   . Tachycardia 05/27/2018    Current Outpatient Medications  Medication Sig Dispense Refill  . atenolol (TENORMIN) 25 MG tablet Take 1 tablet (25 mg total) by mouth daily. 30 tablet 6  . methimazole (TAPAZOLE) 5 MG tablet Take 2 tablets (10 mg total) by mouth daily. 60 tablet 6  . traZODone (DESYREL) 50 MG tablet Take 0.5-1 tablets (25-50 mg total) by mouth at bedtime as needed for sleep. 30 tablet 3   Current Facility-Administered Medications  Medication Dose Route Frequency Provider Last Rate Last Admin  . etonogestrel ([name]) implant 68 mg  68 mg Subdermal Once English, Stephanie D, PA        No Known Allergies  Social History   Socioeconomic History  . Marital status: Single    Spouse name: Not on file  . Number of children: 0  . Years of education: Not on file  . Highest education level: Not on file  Occupational History  . Not on file  Tobacco Use  . Smoking status: Never Smoker  . Smokeless tobacco: Never Used  Substance and Sexual Activity  . Alcohol use: Yes    Comment: occ  . Drug use: No  . Sexual activity: Yes    Birth control/protection: Implant  Other Topics Concern  . Not on file  Social History Narrative   ** Merged History Encounter **  Social Determinants of Health   Financial Resource Strain:   . Difficulty of Paying Living Expenses: Not on file  Food Insecurity:   . Worried About Charity fundraiser in the Last Year: Not on file  . Ran Out of Food in the Last Year: Not on file  Transportation Needs:   . Lack of Transportation (Medical): Not on file  . Lack of Transportation (Non-Medical): Not on file  Physical Activity:   . Days of Exercise per Week: Not on file  . Minutes of Exercise per Session: Not on file  Stress:   . Feeling of Stress : Not on file  Social Connections:   . Frequency of  Communication with Friends and Family: Not on file  . Frequency of Social Gatherings with Friends and Family: Not on file  . Attends Religious Services: Not on file  . Active Member of Clubs or Organizations: Not on file  . Attends Archivist Meetings: Not on file  . Marital Status: Not on file  Intimate Partner Violence:   . Fear of Current or Ex-Partner: Not on file  . Emotionally Abused: Not on file  . Physically Abused: Not on file  . Sexually Abused: Not on file    ROS   Review of Systems See HPI Constitution: No fevers or chills No malaise No diaphoresis Skin: No rash or itching Eyes: no blurry vision, no double vision GU: no dysuria or hematuria Neuro: no dizziness or headaches   Objective    General appearance: alert, and in no distress  Mental Status: alert, oriented to person, place, and time.   Vitals as reported by the patient: There were no vitals filed for this visit.  [name] was seen today for insomnia.  Diagnoses and all orders for this visit:  Graves disease  Hyperthyroidism  Primary insomnia  Other orders -     traZODone (DESYREL) 50 MG tablet; Take 0.5-1 tablets (25-50 mg total) by mouth at bedtime as needed for sleep.    We will follow up in 1 month to review medications.  She is going to try melatonin first at 10 mg nightly.  If that is not sufficient, we will try the trazodone.  Instructions were given to starting trazodone and she was advised to try it on the weekend first.  She is in line with this plan and all questions were answered.   I discussed the assessment and treatment plan with the patient. The patient was provided an opportunity to ask questions and all were answered. The patient agreed with the plan and demonstrated an understanding of the instructions.   The patient was advised to call back or seek an in-person evaluation if the symptoms worsen or if the condition fails to improve as anticipated.  I provided 15   minutes of non-face-to-face time during this encounter.  Glyn Ade, NP  Primary Care at [facility]

## 2019-10-03 ENCOUNTER — Other Ambulatory Visit: Payer: Self-pay

## 2019-10-04 NOTE — Progress Notes (Addendum)
Name: Nancy Mcconnell  MRN/ DOB: [id number], [birth year]    Age/ Sex: 24 y.o., female     PCP: Royal Hawthorn, NP   Reason for Endocrinology Evaluation: Hyperthyroidism     Initial Endocrinology Clinic Visit: 08/02/18    PATIENT IDENTIFIER: Nancy Mcconnell is a 24 y.o., female with a past medical history of Hyperthyrodidism. She has followed with [facility] since 08/02/18 for consultative assistance with management of her Hyperthyroidism.   HISTORICAL SUMMARY: Pt presented to the ED on 05/27/18 with c/o dizziness, nausea and vomiting. She was found to be tachycardic with abnormal liver enzymes. Labs revealed suppressed TSH at < 0.010 uIU/mL with elevated FT4. She was started on Methimazole in September 2019  An attempt to reduce her methimazole to 5 mg daily in January 2020 resulted in hyperthyroidism.  Cousin passed away with Lupus.    She is studying for [employer]   SUBJECTIVE:   During last visit (06/29/19): Continue Methimazole 10 mg daily   Today (10/05/2019):  Nancy Mcconnell is here for 3 month follow up on hyperthyroidism . Pt on methimazole 10 mg daily .    Last week she threw up twice. Which apparently had similar episode in the past with normal LFT's.  She continues to have fatigue and would like iron checked.  Denies diarrhea or palpitations  She continues with occasional  difficulty sleeping and continues to endorse anxiety. Father bough sleeping aid but has not used it yet.  She also had burning in her eyes, has not used artificial eye drops yet as advised in the past.   Last  Ophthalmology exam was  In 11/2018  Denies any local neck symptoms.    ROS:  As per HPI.   HISTORY:  Past Medical History:  Past Medical History:  Diagnosis Date  . Insomnia 09/29/2019  . Medical history non-contributory   . Tachycardia 05/27/2018   Past Surgical History:  Past Surgical History:  Procedure Laterality Date  . NO PAST SURGERIES      Social  History:  reports that she has never smoked. She has never used smokeless tobacco. She reports current alcohol use. She reports that she does not use drugs.  Family History: family history includes Healthy in her brother, brother, and mother; Heart failure in her father.   HOME MEDICATIONS: Allergies as of 10/05/2019   No Known Allergies     Medication List       Accurate as of October 05, 2019  3:56 PM. If you have any questions, ask your nurse or doctor.        atenolol 25 MG tablet Commonly known as: TENORMIN Take 1 tablet (25 mg total) by mouth daily.   methimazole 5 MG tablet Commonly known as: TAPAZOLE Take 2 tablets (10 mg total) by mouth daily.   traZODone 50 MG tablet Commonly known as: DESYREL Take 0.5-1 tablets (25-50 mg total) by mouth at bedtime as needed for sleep.         OBJECTIVE:   PHYSICAL EXAM: VS: BP 116/64 (BP Location: Left Arm, Patient Position: Sitting, Cuff Size: Normal)   Pulse 70   Temp 97.9 F (36.6 C)   Wt 148 lb 6.4 oz (67.3 kg)   SpO2 (!) 89%   BMI 24.70 kg/m    EXAM: General: Pt appears well and is in NAD  Eyes: External eye exam normal with stare, but no lid lag and has minimal exophthalmos.  EOM intact.    Neck: General:  Supple without adenopathy. Thyroid: Thyroid is prominent .  No goiter or nodules appreciated. No thyroid bruit.  Lungs: Clear with good BS bilat with no rales, rhonchi, or wheezes  Heart: Auscultation: RRR.  Abdomen: Normoactive bowel sounds, soft, nontender, without masses or organomegaly palpable  Extremities:  BL LE: No pretibial edema normal ROM and strength.  Mental Status: Judgment, insight: Intact Orientation: Oriented to time, place, and person Mood and affect: No depression, anxiety, or agitation     DATA REVIEWED:  Results for IVAN, MASKELL (MRN [id number]) as of 10/06/2019 07:27  Ref. Range 10/05/2019 17:05  Sodium Latest Ref Range: 135 - 145 mEq/L 137  Potassium Latest Ref Range: 3.5 -  5.1 mEq/L 3.8  Chloride Latest Ref Range: 96 - 112 mEq/L 103  CO2 Latest Ref Range: 19 - 32 mEq/L 27  Glucose Latest Ref Range: 70 - 99 mg/dL 88  BUN Latest Ref Range: 6 - 23 mg/dL 9  Creatinine Latest Ref Range: 0.40 - 1.20 mg/dL 0.66  Calcium Latest Ref Range: 8.4 - 10.5 mg/dL 9.7  Alkaline Phosphatase Latest Ref Range: 39 - 117 U/L 59  Albumin Latest Ref Range: 3.5 - 5.2 g/dL 4.7  AST Latest Ref Range: 0 - 37 U/L 13  ALT Latest Ref Range: 0 - 35 U/L 6  Total Protein Latest Ref Range: 6.0 - 8.3 g/dL 7.8  Total Bilirubin Latest Ref Range: 0.2 - 1.2 mg/dL 0.5  GFR Latest Ref Range: >60.00 mL/min 132.86  WBC Latest Ref Range: 4.0 - 10.5 K/uL 6.6  RBC Latest Ref Range: 3.87 - 5.11 Mil/uL 4.86  Hemoglobin Latest Ref Range: 12.0 - 15.0 g/dL 13.6  HCT Latest Ref Range: 36.0 - 46.0 % 40.4  MCV Latest Ref Range: 78.0 - 100.0 fl 83.0  MCHC Latest Ref Range: 30.0 - 36.0 g/dL 33.7  RDW Latest Ref Range: 11.5 - 15.5 % 13.3  Platelets Latest Ref Range: 150.0 - 400.0 K/uL 268.0  TSH Latest Ref Range: 0.35 - 4.50 uIU/mL 4.45  T4,Free(Direct) Latest Ref Range: 0.60 - 1.60 ng/dL 0.85    ASSESSMENT / PLAN / RECOMMENDATIONS:   1) Hyperthyroidism Secondary to Graves' Disease :  - She is clinically euthyroid - She denies any local neck symptoms. - TSH is normal , trending up, will reduce dose as below , we discussed long term treatment with RAI ablation, will not make final decision yet, will see how her thyroid dose by next visit  - I have also offered her to stop Atenolol but the pt opted to stay on this as it hels with anxiety symptoms.   Medications   Methimazole 5 mg, 1.5 tablets daily    2.) Graves' Disease:  - Pt with mild graves' orbitopathy, was seen by ophthalmology in March, 2020. She was advised to use artificial eye drops .  3) Nausea/Vomiting :    - LFT's normal which excludes methimazole induced hepatitis - Normal TFT's which excludes hyperthyroidism as a cause - Could be  GERD/gastritis - will defer to PCP    4) Fatigue:  - pt requested an iron check, but H/H normal  - I suspect this is secondary to insomnia which is probably related to anxiety issues, she has been encouraged to seek help for this as this is out of the scope of endocrinology.   F/u in 4 months  Labs in 8 weeks    Signed electronically by: Mack Guise, MD  [facility] Endocrin[facility]  [facility] [place]  [address] [place], [place] [address] Phone: [phone] FAX: [phone]      CC: Royal Hawthorn, NP [address] [place] [address] Phone: [phone]  Fax: [phone]   Return to Endocrinology clinic as below: No future appointments.

## 2019-10-05 ENCOUNTER — Encounter: Payer: Self-pay | Admitting: Internal Medicine

## 2019-10-05 ENCOUNTER — Telehealth: Payer: Self-pay | Admitting: Internal Medicine

## 2019-10-05 ENCOUNTER — Ambulatory Visit (INDEPENDENT_AMBULATORY_CARE_PROVIDER_SITE_OTHER): Payer: BLUE CROSS/BLUE SHIELD | Admitting: Internal Medicine

## 2019-10-05 ENCOUNTER — Other Ambulatory Visit (INDEPENDENT_AMBULATORY_CARE_PROVIDER_SITE_OTHER): Payer: BLUE CROSS/BLUE SHIELD

## 2019-10-05 VITALS — BP 116/64 | HR 70 | Temp 97.9°F | Wt 148.4 lb

## 2019-10-05 DIAGNOSIS — E0500 Thyrotoxicosis with diffuse goiter without thyrotoxic crisis or storm: Secondary | ICD-10-CM

## 2019-10-05 DIAGNOSIS — R1110 Vomiting, unspecified: Secondary | ICD-10-CM | POA: Diagnosis not present

## 2019-10-05 DIAGNOSIS — R5382 Chronic fatigue, unspecified: Secondary | ICD-10-CM

## 2019-10-05 LAB — CBC
HCT: 40.4 % (ref 36.0–46.0)
Hemoglobin: 13.6 g/dL (ref 12.0–15.0)
MCHC: 33.7 g/dL (ref 30.0–36.0)
MCV: 83 fl (ref 78.0–100.0)
Platelets: 268 10*3/uL (ref 150.0–400.0)
RBC: 4.86 Mil/uL (ref 3.87–5.11)
RDW: 13.3 % (ref 11.5–15.5)
WBC: 6.6 10*3/uL (ref 4.0–10.5)

## 2019-10-05 LAB — COMPREHENSIVE METABOLIC PANEL
ALT: 6 U/L (ref 0–35)
AST: 13 U/L (ref 0–37)
Albumin: 4.7 g/dL (ref 3.5–5.2)
Alkaline Phosphatase: 59 U/L (ref 39–117)
BUN: 9 mg/dL (ref 6–23)
CO2: 27 mEq/L (ref 19–32)
Calcium: 9.7 mg/dL (ref 8.4–10.5)
Chloride: 103 mEq/L (ref 96–112)
Creatinine, Ser: 0.66 mg/dL (ref 0.40–1.20)
GFR: 132.86 mL/min (ref >60.00)
Glucose, Bld: 88 mg/dL (ref 70–99)
Potassium: 3.8 mEq/L (ref 3.5–5.1)
Sodium: 137 mEq/L (ref 135–145)
Total Bilirubin: 0.5 mg/dL (ref 0.2–1.2)
Total Protein: 7.8 g/dL (ref 6.0–8.3)

## 2019-10-05 NOTE — Telephone Encounter (Signed)
Patient wanted to know what the process was for coming off of the atenolol (TENORMIN) 25 MG tablet that she takes  Call back #[phone]

## 2019-10-05 NOTE — Patient Instructions (Addendum)
-   Continue Methimazole 5 mg,  Two tablets daily

## 2019-10-06 ENCOUNTER — Encounter: Payer: Self-pay | Admitting: Internal Medicine

## 2019-10-06 DIAGNOSIS — R1110 Vomiting, unspecified: Secondary | ICD-10-CM | POA: Insufficient documentation

## 2019-10-06 DIAGNOSIS — R5382 Chronic fatigue, unspecified: Secondary | ICD-10-CM | POA: Insufficient documentation

## 2019-10-06 LAB — T4, FREE: Free T4: 0.85 ng/dL (ref 0.60–1.60)

## 2019-10-06 LAB — TSH: TSH: 4.45 u[IU]/mL (ref 0.35–4.50)

## 2019-10-06 NOTE — Telephone Encounter (Signed)
Pt informed

## 2019-10-06 NOTE — Telephone Encounter (Signed)
Please advise 

## 2019-11-19 ENCOUNTER — Encounter: Payer: Self-pay | Admitting: Internal Medicine

## 2019-11-21 ENCOUNTER — Ambulatory Visit: Payer: BLUE CROSS/BLUE SHIELD | Attending: Internal Medicine

## 2019-11-21 DIAGNOSIS — Z23 Encounter for immunization: Secondary | ICD-10-CM | POA: Insufficient documentation

## 2019-11-21 NOTE — Progress Notes (Signed)
   Covid-19 Vaccination Clinic  Name:  EMOREE SASAKI    MRN: [id number] DOB: 1996-05-23  11/21/2019  Ms. Fischbach was observed post Covid-19 immunization for 15 minutes without incident. She was provided with Vaccine Information Sheet and instruction to access the V-Safe system.   Ms. Belleville was instructed to call 911 with any severe reactions post vaccine: Marland Kitchen Difficulty breathing  . Swelling of face and throat  . A fast heartbeat  . A bad rash all over body  . Dizziness and weakness   Immunizations Administered    Name Date Dose VIS Date Route   Pfizer COVID-19 Vaccine 11/21/2019  6:24 PM 0.3 mL 08/26/2019 Intramuscular   Manufacturer: [employer], [employer]   Lot: [id number]   NDC: [address]-9

## 2019-11-30 ENCOUNTER — Other Ambulatory Visit: Payer: BLUE CROSS/BLUE SHIELD

## 2019-11-30 ENCOUNTER — Other Ambulatory Visit: Payer: Self-pay

## 2019-11-30 ENCOUNTER — Other Ambulatory Visit (INDEPENDENT_AMBULATORY_CARE_PROVIDER_SITE_OTHER): Payer: BLUE CROSS/BLUE SHIELD

## 2019-11-30 DIAGNOSIS — E0500 Thyrotoxicosis with diffuse goiter without thyrotoxic crisis or storm: Secondary | ICD-10-CM | POA: Diagnosis not present

## 2019-12-01 LAB — T4, FREE: Free T4: 1 ng/dL (ref 0.60–1.60)

## 2019-12-01 LAB — TSH: TSH: 4.36 u[IU]/mL (ref 0.35–4.50)

## 2019-12-01 MED ORDER — METHIMAZOLE 5 MG PO TABS: 5.0000 mg | ORAL_TABLET | Freq: Every day | ORAL | 6 refills | Status: DC

## 2019-12-15 ENCOUNTER — Ambulatory Visit: Payer: BLUE CROSS/BLUE SHIELD

## 2019-12-19 ENCOUNTER — Ambulatory Visit: Payer: Self-pay

## 2019-12-21 ENCOUNTER — Ambulatory Visit: Payer: Self-pay

## 2020-01-11 ENCOUNTER — Emergency Department (HOSPITAL_BASED_OUTPATIENT_CLINIC_OR_DEPARTMENT_OTHER)
Admission: EM | Admit: 2020-01-11 | Discharge: 2020-01-11 | Disposition: A | Discharge status: Home / Self Care | Payer: BLUE CROSS/BLUE SHIELD | Attending: Emergency Medicine | Admitting: Emergency Medicine

## 2020-01-11 ENCOUNTER — Other Ambulatory Visit: Payer: Self-pay

## 2020-01-11 ENCOUNTER — Encounter (HOSPITAL_BASED_OUTPATIENT_CLINIC_OR_DEPARTMENT_OTHER): Payer: Self-pay | Admitting: Emergency Medicine

## 2020-01-11 DIAGNOSIS — S30871A Other superficial bite of abdominal wall, initial encounter: Secondary | ICD-10-CM | POA: Insufficient documentation

## 2020-01-11 DIAGNOSIS — Y929 Unspecified place or not applicable: Secondary | ICD-10-CM | POA: Insufficient documentation

## 2020-01-11 DIAGNOSIS — Y999 Unspecified external cause status: Secondary | ICD-10-CM | POA: Insufficient documentation

## 2020-01-11 DIAGNOSIS — S60572A Other superficial bite of hand of left hand, initial encounter: Secondary | ICD-10-CM | POA: Insufficient documentation

## 2020-01-11 DIAGNOSIS — W540XXA Bitten by dog, initial encounter: Secondary | ICD-10-CM | POA: Diagnosis not present

## 2020-01-11 DIAGNOSIS — Y939 Activity, unspecified: Secondary | ICD-10-CM | POA: Insufficient documentation

## 2020-01-11 DIAGNOSIS — Z79899 Other long term (current) drug therapy: Secondary | ICD-10-CM | POA: Diagnosis not present

## 2020-01-11 DIAGNOSIS — S2195XA Open bite of unspecified part of thorax, initial encounter: Secondary | ICD-10-CM

## 2020-01-11 DIAGNOSIS — Z23 Encounter for immunization: Secondary | ICD-10-CM | POA: Insufficient documentation

## 2020-01-11 DIAGNOSIS — S61452A Open bite of left hand, initial encounter: Secondary | ICD-10-CM

## 2020-01-11 MED ORDER — AMOXICILLIN-POT CLAVULANATE 875-125 MG PO TABS
1.0000 | ORAL_TABLET | Freq: Two times a day (BID) | ORAL | 0 refills | Status: DC
Start: 2020-01-11 — End: 2020-02-01

## 2020-01-11 MED ORDER — TETANUS-DIPHTH-ACELL PERTUSSIS 5-2.5-18.5 LF-MCG/0.5 IM SUSP
0.5000 mL | Freq: Once | INTRAMUSCULAR | Status: AC
  Administered 2020-01-11: 0.5 mL via INTRAMUSCULAR
  Filled 2020-01-11: qty 0.5

## 2020-01-11 MED ORDER — AMOXICILLIN-POT CLAVULANATE 875-125 MG PO TABS
1.0000 | ORAL_TABLET | Freq: Once | ORAL | Status: AC
  Administered 2020-01-11: 1 via ORAL
  Filled 2020-01-11: qty 1

## 2020-01-11 MED ORDER — FLUCONAZOLE 150 MG PO TABS
ORAL_TABLET | ORAL | 0 refills | Status: DC
Start: 2020-01-11 — End: 2020-03-07

## 2020-01-11 NOTE — ED Triage Notes (Signed)
Patient presents with complaints of dog bite occurring just prior to arrival; states animal is up to date on shots. 2 puncture wounds noted to right flank area and one area to left hand. No active bleeding noted.

## 2020-01-11 NOTE — ED Provider Notes (Signed)
[facility] Provider Note: Georgena Spurling, MD, FACEP  CSN: [id number] MRN: [id number] ARRIVAL: 01/11/20 at 1927 ROOM: [place]  01/11/20 11:15 PM Nancy Mcconnell is a 24 y.o. female was bitten by an unknown dog just prior to arrival.  This animal is up-to-date on immunizations.  She has 2 puncture wounds to the right flank area and 1 to the left and.  Pain is rated as a 2 out of 10, worse with movement or palpation.  The wounds are superficial and are hemostatic.  Her tetanus is not up-to-date.   Past Medical History:  Diagnosis Date  . Insomnia 09/29/2019  . Tachycardia 05/27/2018    Past Surgical History:  Procedure Laterality Date  . NO PAST SURGERIES      Family History  Problem Relation Age of Onset  . Healthy Mother   . Heart failure Father   . Healthy Brother   . Healthy Brother     Social History   Tobacco Use  . Smoking status: Never Smoker  . Smokeless tobacco: Never Used  Substance Use Topics  . Alcohol use: Yes    Comment: occ  . Drug use: No    Prior to Admission medications   Medication Sig Start Date End Date Taking? Authorizing Provider  atenolol (TENORMIN) 25 MG tablet Take 1 tablet (25 mg total) by mouth daily. 06/29/19   Shamleffer, Melanie Crazier, MD  methimazole (TAPAZOLE) 5 MG tablet Take 1 tablet (5 mg total) by mouth daily. 12/01/19   Shamleffer, Melanie Crazier, MD  traZODone (DESYREL) 50 MG tablet Take 0.5-1 tablets (25-50 mg total) by mouth at bedtime as needed for sleep. 09/28/19 01/11/20  Wendall Mola, NP    Allergies Patient has no known allergies.   REVIEW OF SYSTEMS  Negative except as noted here or in the History of Present Illness.   PHYSICAL EXAMINATION  Initial Vital Signs Blood pressure (!) 129/91, pulse 67, temperature 99.7 F (37.6 C), temperature source Oral, resp. rate 18, height 5\' 5"  (1.651 m), weight 67.3 kg, last menstrual period  12/28/2019, SpO2 99 %.  Examination General: Well-developed, well-nourished female in no acute distress; appearance consistent with age of record HENT: normocephalic; atraumatic Eyes: pupils equal, round and reactive to light; extraocular muscles intact Neck: supple Heart: regular rate and rhythm Lungs: clear to auscultation bilaterally Abdomen: soft; nondistended; nontender; bowel sounds present Extremities: No deformity; full range of motion Neurologic: Awake, alert and oriented; motor function intact in all extremities and symmetric; no facial droop Skin: Warm and dry; 2 superficial puncture wounds to right flank, superficial scratch to dorsal left hand:      Psychiatric: Normal mood and affect   RESULTS  Summary of this visit's results, reviewed and interpreted by myself:   EKG Interpretation  Date/Time:    Ventricular Rate:    PR Interval:    QRS Duration:   QT Interval:    QTC Calculation:   R Axis:     Text Interpretation:        Laboratory Studies: No results found for this or any previous visit (from the past 24 hour(s)). Imaging Studies: No results found.  ED COURSE and MDM  Nursing notes, initial and subsequent vitals signs, including pulse oximetry, reviewed and interpreted by myself.  Vitals:   01/11/20 1951 01/11/20 1952  BP: (!) 129/91   Pulse: 67   Resp: 18   Temp: 99.7 F (37.6  C)   TempSrc: Oral   SpO2: 99%   Weight:  67.3 kg  Height:  5\' 5"  (1.651 m)   Medications  Tdap (BOOSTRIX) injection 0.5 mL (has no administration in time range)  amoxicillin-clavulanate (AUGMENTIN) 875-125 MG per tablet 1 tablet (has no administration in time range)    The dog is known and animal control will be contacted.  The wounds are superficial but we will treat with Augmentin to prevent infection.  PROCEDURES  Procedures   ED DIAGNOSES     ICD-10-CM   1. Dog bite, hand, left, initial encounter  S61.452A    W54.0XXA   2. Dog bite of trunk, initial  encounter  S21.95XA    W54. , MD 01/11/20 [phone]

## 2020-01-20 ENCOUNTER — Other Ambulatory Visit: Payer: Self-pay | Admitting: Internal Medicine

## 2020-02-01 ENCOUNTER — Ambulatory Visit (INDEPENDENT_AMBULATORY_CARE_PROVIDER_SITE_OTHER): Payer: BLUE CROSS/BLUE SHIELD | Admitting: Internal Medicine

## 2020-02-01 ENCOUNTER — Encounter: Payer: Self-pay | Admitting: Internal Medicine

## 2020-02-01 ENCOUNTER — Other Ambulatory Visit: Payer: Self-pay

## 2020-02-01 VITALS — BP 110/72 | HR 71 | Temp 98.5°F | Ht 65.0 in | Wt 152.8 lb

## 2020-02-01 DIAGNOSIS — E0500 Thyrotoxicosis with diffuse goiter without thyrotoxic crisis or storm: Secondary | ICD-10-CM | POA: Diagnosis not present

## 2020-02-01 DIAGNOSIS — E0590 Thyrotoxicosis, unspecified without thyrotoxic crisis or storm: Secondary | ICD-10-CM

## 2020-02-01 MED ORDER — ATENOLOL 25 MG PO TABS: 25.0000 mg | ORAL_TABLET | Freq: Every day | ORAL | 3 refills | Status: DC

## 2020-02-01 NOTE — Patient Instructions (Addendum)
-   Continue Methimazole 5 mg, 1 tablet  daily  - Atenolol 25 mg daily

## 2020-02-01 NOTE — Progress Notes (Signed)
Name: Nancy Mcconnell  MRN/ DOB: [id number], [birth year]    Age/ Sex: 24 y.o., female     PCP: Wendall Mola, NP   Reason for Endocrinology Evaluation: Hyperthyroidism     Initial Endocrinology Clinic Visit: 08/02/18    PATIENT IDENTIFIER: Nancy Mcconnell is a 24 y.o., female with a past medical history of Hyperthyrodidism. She has followed with [facility] since 08/02/18 for consultative assistance with management of her Hyperthyroidism.   HISTORICAL SUMMARY: Pt presented to the ED on 05/27/18 with c/o dizziness, nausea and vomiting. She was found to be tachycardic with abnormal liver enzymes. Labs revealed suppressed TSH at < 0.010 uIU/mL with elevated FT4. She was started on Methimazole in September 2019  An attempt to reduce her methimazole to 5 mg daily in January 2020 resulted in hyperthyroidism.  Cousin passed away with Lupus.    She is studying for [employer]   SUBJECTIVE:   During last visit (10/05/2019): Decreased  Methimazole    Today (02/03/2020):  Nancy Mcconnell is here for 4 month follow up on hyperthyroidism . Pt on methimazole mg daily .    Denies weight loss, diarrhea or constipation   Denies any local neck symptoms.      ROS:  As per HPI.   HISTORY:  Past Medical History:  Past Medical History:  Diagnosis Date  . Insomnia 09/29/2019  . Tachycardia 05/27/2018   Past Surgical History:  Past Surgical History:  Procedure Laterality Date  . NO PAST SURGERIES      Social History:  reports that she has never smoked. She has never used smokeless tobacco. She reports current alcohol use. She reports that she does not use drugs.  Family History: family history includes Healthy in her brother, brother, and mother; Heart failure in her father.   HOME MEDICATIONS: Allergies as of 02/01/2020   No Known Allergies     Medication List       Accurate as of Feb 01, 2020 11:59 PM. If you have any questions, ask your nurse or doctor.         STOP taking these medications   amoxicillin-clavulanate 875-125 MG tablet Commonly known as: Augmentin Stopped by: Dorita Sciara, MD     TAKE these medications   atenolol 25 MG tablet Commonly known as: TENORMIN Take 1 tablet (25 mg total) by mouth daily.   fluconazole 150 MG tablet Commonly known as: Diflucan Take 1 tablet as needed for vaginal yeast infection.  May repeat in 3 days if symptoms persist.   methimazole 5 MG tablet Commonly known as: TAPAZOLE Take 1 tablet (5 mg total) by mouth daily.         OBJECTIVE:   PHYSICAL EXAM: VS: BP 110/72 (BP Location: Right Arm, Patient Position: Sitting, Cuff Size: Normal)   Pulse 71   Temp 98.5 F (36.9 C)   Ht 5\' 5"  (1.651 m)   Wt 152 lb 12.8 oz (69.3 kg)   LMP 01/18/2020 (Exact Date)   SpO2 98%   BMI 25.43 kg/m    EXAM: General: Pt appears well and is in NAD  Eyes: External eye exam normal with stare R>L, noted with lid lag and  minimal exophthalmos.  EOM intact.    Neck: General: Supple without adenopathy. Thyroid: Thyroid is prominent .  No goiter or nodules appreciated. No thyroid bruit.  Lungs: Clear with good BS bilat with no rales, rhonchi, or wheezes  Heart: Auscultation: RRR.  Abdomen: Normoactive bowel sounds, soft,  nontender, without masses or organomegaly palpable  Extremities:  BL LE: No pretibial edema normal ROM and strength.  Mental Status: Judgment, insight: Intact Orientation: Oriented to time, place, and person Mood and affect: No depression, anxiety, or agitation     DATA REVIEWED:  Results for Nancy, Mcconnell (MRN [id number]) as of 02/03/2020 11:02  Ref. Range 02/01/2020 16:36  Sodium Latest Ref Range: 135 - 145 mEq/L 138  Potassium Latest Ref Range: 3.5 - 5.1 mEq/L 3.5  Chloride Latest Ref Range: 96 - 112 mEq/L 105  CO2 Latest Ref Range: 19 - 32 mEq/L 28  Glucose Latest Ref Range: 70 - 99 mg/dL 96  BUN Latest Ref Range: 6 - 23 mg/dL 6  Creatinine Latest Ref Range: 0.40 -  1.20 mg/dL 0.86  Calcium Latest Ref Range: 8.4 - 10.5 mg/dL 9.1  Alkaline Phosphatase Latest Ref Range: 39 - 117 U/L 47  Albumin Latest Ref Range: 3.5 - 5.2 g/dL 4.3  AST Latest Ref Range: 0 - 37 U/L 16  ALT Latest Ref Range: 0 - 35 U/L 8  Total Protein Latest Ref Range: 6.0 - 8.3 g/dL 7.1  Total Bilirubin Latest Ref Range: 0.2 - 1.2 mg/dL 0.4  GFR Latest Ref Range: >60.00 mL/min 160.16  TSH Latest Ref Range: 0.35 - 4.50 uIU/mL 3.10  T4,Free(Direct) Latest Ref Range: 0.60 - 1.60 ng/dL 7.61     ASSESSMENT / PLAN / RECOMMENDATIONS:   1) Hyperthyroidism Secondary to Graves' Disease :  - She is clinically euthyroid - She denies any local neck symptoms. - Based on normal TFT's today , will adjust methimazole as below  - She already has been on Methimazole for over 1.5 yrs . Typically we discussed long term treatment with either RAI ablation vs surgery around this time, but I am concerned about her eyes. We discussed RAI ablation could exacerbate Graves' orbitopathy, I have encouraged her to have an updated exam and have those results faxed to [place].  - AT this time , will continue working on reducing methimazole dose, a few attempts in the past have failed and resulted in subclinical hyperthyroidism    Medications   Methimazole 5 mg, 1 tablet MOnday - Friday, take Half a tablet on Saturdays and Sundays     2.) Graves' Disease:  - Pt with mild graves' orbitopathy, was seen by ophthalmology in March, 2020. She was advised to use artificial eye drops . - She was advised to have an updated exam and have the report faxed to [place].       F/u in 4 months     Signed electronically by: Lyndle Herrlich, MD  [facility] End[facility]in[facility]  [facility] [address] [place] [place], [place] [address] Phone: [phone] FAX: [phone]      CC: Royal Hawthorn, NP No address on file Phone: None  Fax: None   Return to Endocrinology clinic as below: Future  Appointments  Date Time Provider Department Center  06/04/2020  3:40 PM Bethann Qualley, Konrad Dolores, MD [id number] None

## 2020-02-02 LAB — COMPREHENSIVE METABOLIC PANEL
ALT: 8 U/L (ref 0–35)
AST: 16 U/L (ref 0–37)
Albumin: 4.3 g/dL (ref 3.5–5.2)
Alkaline Phosphatase: 47 U/L (ref 39–117)
BUN: 6 mg/dL (ref 6–23)
CO2: 28 mEq/L (ref 19–32)
Calcium: 9.1 mg/dL (ref 8.4–10.5)
Chloride: 105 mEq/L (ref 96–112)
Creatinine, Ser: 0.56 mg/dL (ref 0.40–1.20)
GFR: 160.16 mL/min (ref >60.00)
Glucose, Bld: 96 mg/dL (ref 70–99)
Potassium: 3.5 mEq/L (ref 3.5–5.1)
Sodium: 138 mEq/L (ref 135–145)
Total Bilirubin: 0.4 mg/dL (ref 0.2–1.2)
Total Protein: 7.1 g/dL (ref 6.0–8.3)

## 2020-02-02 LAB — T4, FREE: Free T4: 0.95 ng/dL (ref 0.60–1.60)

## 2020-02-02 LAB — TSH: TSH: 3.1 u[IU]/mL (ref 0.35–4.50)

## 2020-02-06 ENCOUNTER — Ambulatory Visit (INDEPENDENT_AMBULATORY_CARE_PROVIDER_SITE_OTHER): Payer: BLUE CROSS/BLUE SHIELD | Admitting: Registered Nurse

## 2020-02-06 ENCOUNTER — Other Ambulatory Visit: Payer: Self-pay

## 2020-02-06 ENCOUNTER — Encounter: Payer: Self-pay | Admitting: Registered Nurse

## 2020-02-06 VITALS — BP 112/76 | HR 80 | Temp 98.3°F | Resp 16 | Ht 65.0 in | Wt 152.6 lb

## 2020-02-06 DIAGNOSIS — E0500 Thyrotoxicosis with diffuse goiter without thyrotoxic crisis or storm: Secondary | ICD-10-CM

## 2020-02-06 DIAGNOSIS — R1111 Vomiting without nausea: Secondary | ICD-10-CM

## 2020-02-06 LAB — POCT CBC
Granulocyte percent: 59.5 %G (ref 37–80)
HCT, POC: 39.4 % (ref 29–41)
Hemoglobin: 13.2 g/dL (ref 11–14.6)
Lymph, poc: 2.1 (ref 0.6–3.4)
MCH, POC: 28.5 pg (ref 27–31.2)
MCHC: 33.5 g/dL (ref 31.8–35.4)
MCV: 85.1 fL (ref 76–111)
MID (cbc): 0.7 (ref 0–0.9)
MPV: 7.3 fL (ref 0–99.8)
POC Granulocyte: 4.2 (ref 2–6.9)
POC LYMPH PERCENT: 30.4 %L (ref 10–50)
POC MID %: 10.1 %M (ref 0–12)
Platelet Count, POC: 320 10*3/uL (ref 142–424)
RBC: 4.63 M/uL (ref 4.04–5.48)
RDW, POC: 13.6 %
WBC: 7 10*3/uL (ref 4.6–10.2)

## 2020-02-06 LAB — POCT GLYCOSYLATED HEMOGLOBIN (HGB A1C): Hemoglobin A1C: 5.2 % (ref 4.0–5.6)

## 2020-02-06 LAB — POCT URINALYSIS DIP (CLINITEK)
Glucose, UA: NEGATIVE mg/dL
Ketones, POC UA: NEGATIVE mg/dL
Nitrite, UA: NEGATIVE
POC PROTEIN,UA: 100 — AB
Spec Grav, UA: 1.02 (ref 1.010–1.025)
Urobilinogen, UA: >=8 E.U./dL — AB
pH, UA: 8.5 — AB (ref 5.0–8.0)

## 2020-02-06 LAB — POCT URINE PREGNANCY: Preg Test, Ur: NEGATIVE

## 2020-02-06 MED ORDER — ONDANSETRON HCL 4 MG PO TABS: 4.0000 mg | ORAL_TABLET | Freq: Three times a day (TID) | ORAL | 0 refills | Status: DC | PRN

## 2020-02-06 NOTE — Progress Notes (Signed)
Acute Office Visit  Subjective:    Patient ID: Nancy Mcconnell, female    DOB: 05/05/1996, 24 y.o.   MRN: [id number]  Chief Complaint  Patient presents with  . GI Problem    Patient states she has graves disease and went to endocrinology and told them that she wasnt able to keep any food or drink down for the last 7 days. so she might need a referral to take the next steps to get better     HPI Patient is in today for vomiting  Onset one week ago. Sudden. Multiple times daily after eating or drinking any fluids. No coffee ground emesis.  Nothing makes it better. Worse after eating. Some abdominal pain - dull, suprapubic and at umbilicus.  No changes to stool. No nvd, no urinary symptoms, no vaginal symptoms. Sexually active, using protection, not concerned for pregnancy.   Otherwise no clear pattern or etiology. Has not happened to her before.  Past Medical History:  Diagnosis Date  . Insomnia 09/29/2019  . Tachycardia 05/27/2018    Past Surgical History:  Procedure Laterality Date  . NO PAST SURGERIES      Family History  Problem Relation Age of Onset  . Healthy Mother   . Heart failure Father   . Healthy Brother   . Healthy Brother     Social History   Socioeconomic History  . Marital status: Single    Spouse name: Not on file  . Number of children: 0  . Years of education: Not on file  . Highest education level: Not on file  Occupational History  . Not on file  Tobacco Use  . Smoking status: Never Smoker  . Smokeless tobacco: Never Used  Substance and Sexual Activity  . Alcohol use: Yes    Comment: occ  . Drug use: No  . Sexual activity: Yes    Birth control/protection: Implant  Other Topics Concern  . Not on file  Social History Narrative   ** Merged History Encounter **       Social Determinants of Health   Financial Resource Strain:   . Difficulty of Paying Living Expenses:   Food Insecurity:   . Worried About Charity fundraiser in the  Last Year:   . Arboriculturist in the Last Year:   Transportation Needs:   . Film/video editor (Medical):   Marland Kitchen Lack of Transportation (Non-Medical):   Physical Activity:   . Days of Exercise per Week:   . Minutes of Exercise per Session:   Stress:   . Feeling of Stress :   Social Connections:   . Frequency of Communication with Friends and Family:   . Frequency of Social Gatherings with Friends and Family:   . Attends Religious Services:   . Active Member of Clubs or Organizations:   . Attends Archivist Meetings:   Marland Kitchen Marital Status:   Intimate Partner Violence:   . Fear of Current or Ex-Partner:   . Emotionally Abused:   Marland Kitchen Physically Abused:   . Sexually Abused:     Outpatient Medications Prior to Visit  Medication Sig Dispense Refill  . atenolol (TENORMIN) 25 MG tablet Take 1 tablet (25 mg total) by mouth daily. 90 tablet 3  . methimazole (TAPAZOLE) 5 MG tablet Take 1 tablet (5 mg total) by mouth daily. 30 tablet 6  . fluconazole (DIFLUCAN) 150 MG tablet Take 1 tablet as needed for vaginal yeast infection.  May repeat in  3 days if symptoms persist. (Patient not taking: Reported on 02/06/2020) 2 tablet 0   Facility-Administered Medications Prior to Visit  Medication Dose Route Frequency Provider Last Rate Last Admin  . etonogestrel ([name]) implant 68 mg  68 mg Subdermal Once English, Stephanie D, PA        No Known Allergies  Review of Systems  Constitutional: Negative.   HENT: Negative.   Eyes: Negative.   Respiratory: Negative.   Cardiovascular: Negative.   Gastrointestinal: Positive for abdominal pain and vomiting. Negative for abdominal distention, anal bleeding, blood in stool, constipation, diarrhea, nausea and rectal pain.  Endocrine: Negative.   Genitourinary: Negative.   Musculoskeletal: Negative.   Skin: Negative.   Allergic/Immunologic: Negative.   Neurological: Negative.   Hematological: Negative.   Psychiatric/Behavioral: Negative.     All other systems reviewed and are negative.      Objective:    Physical Exam Vitals and nursing note reviewed.  Constitutional:      General: She is not in acute distress.    Appearance: Normal appearance. She is normal weight. She is not ill-appearing, toxic-appearing or diaphoretic.  Cardiovascular:     Rate and Rhythm: Normal rate and regular rhythm.  Pulmonary:     Effort: Pulmonary effort is normal. No respiratory distress.  Abdominal:     General: Abdomen is flat. Bowel sounds are normal. There is no distension.     Palpations: Abdomen is soft. There is no mass.     Tenderness: There is no abdominal tenderness. There is no right CVA tenderness, left CVA tenderness, guarding or rebound.     Hernia: No hernia is present.  Skin:    General: Skin is warm and dry.     Capillary Refill: Capillary refill takes less than 2 seconds.     Coloration: Skin is not jaundiced or pale.     Findings: No bruising, erythema, lesion or rash.  Neurological:     General: No focal deficit present.     Mental Status: She is alert and oriented to person, place, and time. Mental status is at baseline.     Cranial Nerves: No cranial nerve deficit.  Psychiatric:        Mood and Affect: Mood normal.        Behavior: Behavior normal.        Thought Content: Thought content normal.        Judgment: Judgment normal.     BP 112/76   Pulse 80   Temp 98.3 F (36.8 C) (Temporal)   Resp 16   Ht 5' 5"  (1.651 m)   Wt 152 lb 9.6 oz (69.2 kg)   LMP 01/18/2020 (Exact Date)   SpO2 99%   BMI 25.39 kg/m  Wt Readings from Last 3 Encounters:  02/06/20 152 lb 9.6 oz (69.2 kg)  02/01/20 152 lb 12.8 oz (69.3 kg)  01/11/20 148 lb 5.9 oz (67.3 kg)    There are no preventive care reminders to display for this patient.  There are no preventive care reminders to display for this patient.   Lab Results  Component Value Date   TSH 3.10 02/01/2020   Lab Results  Component Value Date   WBC 7.0  02/06/2020   HGB 13.2 02/06/2020   HCT 39.4 02/06/2020   MCV 85.1 02/06/2020   PLT 268.0 10/05/2019   Lab Results  Component Value Date   NA 138 02/01/2020   K 3.5 02/01/2020   CO2 28 02/01/2020   GLUCOSE 96  02/01/2020   BUN 6 02/01/2020   CREATININE 0.56 02/01/2020   BILITOT 0.4 02/01/2020   ALKPHOS 47 02/01/2020   AST 16 02/01/2020   ALT 8 02/01/2020   PROT 7.1 02/01/2020   ALBUMIN 4.3 02/01/2020   CALCIUM 9.1 02/01/2020   ANIONGAP 7 05/28/2018   GFR 160.16 02/01/2020   No results found for: CHOL No results found for: HDL No results found for: LDLCALC No results found for: TRIG No results found for: CHOLHDL Lab Results  Component Value Date   HGBA1C 5.2 02/06/2020       Assessment & Plan:   Problem List Items Addressed This Visit      Digestive   Vomiting - Primary   Relevant Medications   ondansetron (ZOFRAN) 4 MG tablet   Other Relevant Orders   POCT CBC (Completed)   POCT urine pregnancy (Completed)   POCT URINALYSIS DIP (CLINITEK) (Completed)   Comprehensive metabolic panel   TSH   ANA   Sedimentation Rate   C-reactive protein   POCT glycosylated hemoglobin (Hb A1C) (Completed)   Amylase   Lipase   Urine Culture   Ambulatory referral to Gastroenterology     Endocrine   Graves disease (Chronic)   Relevant Orders   Ambulatory referral to Endocrinology       Meds ordered this encounter  Medications  . ondansetron (ZOFRAN) 4 MG tablet    Sig: Take 1 tablet (4 mg total) by mouth every 8 (eight) hours as needed for nausea or vomiting.    Dispense:  20 tablet    Refill:  0    Order Specific Question:   Supervising Provider    Answer:   Forrest Moron [id number]    PLAN  zofran 62m PO tid PRN for vomiting  Labs collected  POCT CBC wnl, POCT urine preg neg, POCT UA showing blood and wbc - will send for culture.  Also collected cmp, tsh, a1c, crp, esr, lipase, amylase, and ana. Will follow up as warranted  Exam unremarkable  Will  refer to GI in anticipation of further management  Pt requesting second opinion on Graves Disease- will send referral.  Patient encouraged to call clinic with any questions, comments, or concerns.  RMaximiano Coss NP

## 2020-02-06 NOTE — Patient Instructions (Signed)
° ° ° °  If you have lab work done today you will be contacted with your lab results within the next 2 weeks.  If you have not heard from us then please contact us. The fastest way to get your results is to register for My Chart. ° ° °IF you received an x-ray today, you will receive an invoice from [facility]. Please contact [facility] at [phone] with questions or concerns regarding your invoice.  ° °IF you received labwork today, you will receive an invoice from [facility]. Please contact [facility] at [phone] with questions or concerns regarding your invoice.  ° °Our billing staff will not be able to assist you with questions regarding bills from these companies. ° °You will be contacted with the lab results as soon as they are available. The fastest way to get your results is to activate your My Chart account. Instructions are located on the last page of this paperwork. If you have not heard from us regarding the results in 2 weeks, please contact this office. °  ° ° ° °

## 2020-02-07 ENCOUNTER — Encounter: Payer: Self-pay | Admitting: Registered Nurse

## 2020-02-07 LAB — COMPREHENSIVE METABOLIC PANEL
ALT: 6 IU/L (ref 0–32)
AST: 14 IU/L (ref 0–40)
Albumin/Globulin Ratio: 1.7 (ref 1.2–2.2)
Albumin: 4.5 g/dL (ref 3.9–5.0)
Alkaline Phosphatase: 59 IU/L (ref 48–121)
BUN/Creatinine Ratio: 11 (ref 9–23)
BUN: 6 mg/dL (ref 6–20)
Bilirubin Total: 0.4 mg/dL (ref 0.0–1.2)
CO2: 21 mmol/L (ref 20–29)
Calcium: 9.2 mg/dL (ref 8.7–10.2)
Chloride: 105 mmol/L (ref 96–106)
Creatinine, Ser: 0.57 mg/dL (ref 0.57–1.00)
GFR calc Af Amer: 150 mL/min/{1.73_m2} (ref >59)
GFR calc non Af Amer: 130 mL/min/{1.73_m2} (ref >59)
Globulin, Total: 2.6 g/dL (ref 1.5–4.5)
Glucose: 83 mg/dL (ref 65–99)
Potassium: 3.7 mmol/L (ref 3.5–5.2)
Sodium: 141 mmol/L (ref 134–144)
Total Protein: 7.1 g/dL (ref 6.0–8.5)

## 2020-02-07 LAB — SEDIMENTATION RATE: Sed Rate: 5 mm/hr (ref 0–32)

## 2020-02-07 LAB — LIPASE: Lipase: 32 U/L (ref 14–72)

## 2020-02-07 LAB — ANA: Anti Nuclear Antibody (ANA): NEGATIVE

## 2020-02-07 LAB — TSH: TSH: 1.37 u[IU]/mL (ref 0.450–4.500)

## 2020-02-07 LAB — AMYLASE: Amylase: 57 U/L (ref 31–110)

## 2020-02-07 LAB — C-REACTIVE PROTEIN: CRP: 8 mg/L (ref 0–10)

## 2020-02-08 ENCOUNTER — Encounter: Payer: Self-pay | Admitting: Gastroenterology

## 2020-02-08 LAB — URINE CULTURE

## 2020-03-07 ENCOUNTER — Encounter: Payer: Self-pay | Admitting: Gastroenterology

## 2020-03-07 ENCOUNTER — Ambulatory Visit (INDEPENDENT_AMBULATORY_CARE_PROVIDER_SITE_OTHER): Payer: 59 | Admitting: Gastroenterology

## 2020-03-07 VITALS — BP 104/60 | HR 72 | Ht 65.0 in | Wt 154.0 lb

## 2020-03-07 DIAGNOSIS — R112 Nausea with vomiting, unspecified: Secondary | ICD-10-CM | POA: Diagnosis not present

## 2020-03-07 NOTE — Patient Instructions (Signed)
If you are age 25 or older, your body mass index should be between 23-30. Your Body mass index is 25.63 kg/m. If this is out of the aforementioned range listed, please consider follow up with your Primary Care Provider.  If you are age 44 or younger, your body mass index should be between 19-25. Your Body mass index is 25.63 kg/m. If this is out of the aformentioned range listed, please consider follow up with your Primary Care Provider.   It was a pleasure to see you today!  Dr. Myrtie Neither

## 2020-03-07 NOTE — Progress Notes (Signed)
[facility] Consult Note:  History: Nancy Mcconnell 03/07/2020  Referring provider: Maximiano Coss, NP  Reason for consult/chief complaint: Nausea (with vomiting has subsided for the past 1.5 weeks)   Subjective  HPI:  This is a very pleasant 24 year old woman referred by primary care for recent nausea and vomiting.  When seen by primary care about a month ago, she had had about a week of vomiting after every meal.  She was not having abdominal pain but just felt quite nauseated and would bring everything back up.  This lasted about another 1 week to 10 days after that visit, including during a vacation, and then subsided.  It seemed to come on gradually at first, and also went away gradually and has not recurred for least the last week and a half.  At this point she has no abdominal pain, nausea, vomiting, dysphagia or odynophagia, and at no point did she have black or bloody stool. She went to her endocrinologist first because the symptoms felt reminiscent of her presentation with Graves' disease.  I reviewed most recent primary care and endocrinology notes, and her thyroid function was normal when seen by endocrinology last month. Nancy Mcconnell had not been on any new meds, no sick contacts, no other illnesses or clear triggering factors for the symptoms.  She did feel that she was going through a lot of school related stress but is not sure if that could explain it.   ROS:  Review of Systems  Constitutional: Negative for appetite change and unexpected weight change.  HENT: Negative for mouth sores and voice change.   Eyes: Negative for pain and redness.  Respiratory: Negative for cough and shortness of breath.   Cardiovascular: Negative for chest pain and palpitations.  Genitourinary: Negative for dysuria and hematuria.  Musculoskeletal: Negative for arthralgias and myalgias.  Skin: Negative for pallor and rash.  Neurological: Negative for weakness and headaches.    Hematological: Negative for adenopathy.     Past Medical History: Past Medical History:  Diagnosis Date  . Graves disease 05/2018  . Insomnia 09/29/2019  . Tachycardia 05/27/2018   Review of recent endocrinology note indicates Graves' disease diagnosed September 2019 with dizziness, nausea and vomiting and tachycardia with elevated LFTs.  TSH very low and elevated free T4, she was then prescribed methimazole.  Past Surgical History: Past Surgical History:  Procedure Laterality Date  . NO PAST SURGERIES       Family History: Family History  Problem Relation Age of Onset  . Healthy Mother   . Heart failure Father   . Healthy Brother   . Healthy Brother     Social History: Social History   Socioeconomic History  . Marital status: Single    Spouse name: Not on file  . Number of children: 0  . Years of education: Not on file  . Highest education level: Not on file  Occupational History  . Not on file  Tobacco Use  . Smoking status: Never Smoker  . Smokeless tobacco: Never Used  Vaping Use  . Vaping Use: Never used  Substance and Sexual Activity  . Alcohol use: Yes    Comment: occ  . Drug use: No  . Sexual activity: Yes    Birth control/protection: Implant  Other Topics Concern  . Not on file  Social History Narrative   ** Merged History Encounter **       Social Determinants of Health   Financial Resource Strain:   . Difficulty of  Paying Living Expenses:   Food Insecurity:   . Worried About Charity fundraiser in the Last Year:   . Arboriculturist in the Last Year:   Transportation Needs:   . Film/video editor (Medical):   Marland Kitchen Lack of Transportation (Non-Medical):   Physical Activity:   . Days of Exercise per Week:   . Minutes of Exercise per Session:   Stress:   . Feeling of Stress :   Social Connections:   . Frequency of Communication with Friends and Family:   . Frequency of Social Gatherings with Friends and Family:   . Attends Religious  Services:   . Active Member of Clubs or Organizations:   . Attends Archivist Meetings:   Marland Kitchen Marital Status:     Allergies: No Known Allergies  Outpatient Meds: Current Outpatient Medications  Medication Sig Dispense Refill  . atenolol (TENORMIN) 25 MG tablet Take 1 tablet (25 mg total) by mouth daily. 90 tablet 3  . methimazole (TAPAZOLE) 5 MG tablet Take 1 tablet (5 mg total) by mouth daily. 30 tablet 6   Current Facility-Administered Medications  Medication Dose Route Frequency Provider Last Rate Last Admin  . etonogestrel ([name]) implant 68 mg  68 mg Subdermal Once English, Stephanie D, PA          ___________________________________________________________________ Objective   Exam:  BP 104/60   Pulse 72   Ht 5' 5"  (1.651 m)   Wt 154 lb (69.9 kg)   LMP 03/03/2020 (Approximate)   BMI 25.63 kg/m    General: Well-appearing  Eyes: sclera anicteric, no redness.  Exophthalmos   ENT: oral mucosa moist without lesions, no cervical or supraclavicular lymphadenopathy  CV: RRR without murmur, S1/S2, no JVD, no peripheral edema  Resp: clear to auscultation bilaterally, normal RR and effort noted  GI: soft, no tenderness, with active bowel sounds. No guarding or palpable organomegaly noted.  Skin; warm and dry, no rash or jaundice noted  Neuro: awake, alert and oriented x 3. Normal gross motor function and fluent speech  Labs:  CBC Latest Ref Rng & Units 02/06/2020 10/05/2019 07/16/2018  WBC 4.6 - 10.2 K/uL 7.0 6.6 6.0  Hemoglobin 11 - 14.6 g/dL 13.2 13.6 12.3  Hematocrit 29 - 41 % 39.4 40.4 36.6  Platelets 150 - 400 K/uL - 268.0 372   CMP Latest Ref Rng & Units 02/06/2020 02/01/2020 10/05/2019  Glucose 65 - 99 mg/dL 83 96 88  BUN 6 - 20 mg/dL 6 6 9   Creatinine 0.57 - 1.00 mg/dL 0.57 0.56 0.66  Sodium 134 - 144 mmol/L 141 138 137  Potassium 3.5 - 5.2 mmol/L 3.7 3.5 3.8  Chloride 96 - 106 mmol/L 105 105 103  CO2 20 - 29 mmol/L 21 28 27   Calcium 8.7 -  10.2 mg/dL 9.2 9.1 9.7  Total Protein 6.0 - 8.5 g/dL 7.1 7.1 7.8  Total Bilirubin 0.0 - 1.2 mg/dL 0.4 0.4 0.5  Alkaline Phos 48 - 121 IU/L 59 47 59  AST 0 - 40 IU/L 14 16 13   ALT 0 - 32 IU/L 6 8 6   ESR 5 Recent nml TSH and free T4 Hgb A1c = 5.2  Radiologic Studies:  Abdominal ultrasound report from September 2019 when patient's LFTs were elevated in the setting of Graves' diagnosis:  CLINICAL DATA:  Tachycardia for 2 weeks, elevated liver function test.   EXAM: ULTRASOUND ABDOMEN LIMITED RIGHT UPPER QUADRANT   COMPARISON:  None.   FINDINGS: Gallbladder:  No gallstones or wall thickening visualized. No sonographic Murphy sign noted by sonographer.   Common bile duct:   Diameter: 1 mm   Liver:   No focal lesion identified. Within normal limits in parenchymal echogenicity. Portal triads are echogenic. Portal vein is patent on color Doppler imaging with normal direction of blood flow towards the liver.   Trace ascites at Morison's pouch.   IMPRESSION: 1. 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 Liver appears otherwise unremarkable. 2. Trace ascites in Morrison's pouch. 3. Gallbladder appears normal. No gallstones. No evidence of cholecystitis.     Electronically Signed   By: Franki Cabot M.D.   On: 05/27/2018 20:59"   Assessment: Encounter Diagnosis  Name Primary?  . Nausea and vomiting in adult Yes    Recent period of nausea and vomiting lasting a little over 2 weeks, no associated symptoms, no clear triggers.  It was improved somewhat with Zofran prescribed by primary care provider.  At this point she seems back to normal and has no residual symptoms.  Cause unclear, perhaps was an initial infectious trigger.  At this point, it seems no further testing or treatment needed at this point.  Plan:  Contact me as needed.  Thank you for the courtesy of this consult.  Please call  me with any questions or concerns.  Nelida Meuse III  CC: Referring provider noted above

## 2020-05-03 ENCOUNTER — Ambulatory Visit: Payer: BLUE CROSS/BLUE SHIELD | Admitting: Family Medicine

## 2020-06-03 NOTE — Progress Notes (Signed)
Name: Nancy Mcconnell  MRN/ DOB: [id number], 08-28-1996    Age/ Sex: 24 y.o., female     PCP: Janeece Agee, NP   Reason for Endocrinology Evaluation: Hyperthyroidism     Initial Endocrinology Clinic Visit: 08/02/18    PATIENT IDENTIFIER: Nancy Mcconnell is a 24 y.o., female with a past medical history of Hyperthyrodidism. She has followed with [facility] since 08/02/18 for consultative assistance with management of her Hyperthyroidism.   HISTORICAL SUMMARY: Pt presented to the ED on 05/27/18 with c/o dizziness, nausea and vomiting. She was found to be tachycardic with abnormal liver enzymes. Labs revealed suppressed TSH at < 0.010 uIU/mL with elevated FT4. She was started on Methimazole in September 2019  An attempt to reduce her methimazole to 5 mg daily in January 2020 resulted in hyperthyroidism.  Cousin passed away with Lupus.    She is studying for [employer]   SUBJECTIVE:   Today (06/03/2020):  Nancy Mcconnell is here for 4 month follow up on hyperthyroidism . Pt on methimazole daily    She has not been  on 2.5 mg on saturdays and sundays and 1 tab rest of the week as advised back in May. , She continues with taking 1 tablet daily  Denies  diarrhea or constipation  Has occasional dysphagia Continue with fatigue and anxiety      HISTORY:  Past Medical History:  Past Medical History:  Diagnosis Date  . Graves disease 05/2018  . Insomnia 09/29/2019  . Tachycardia 05/27/2018   Past Surgical History:  Past Surgical History:  Procedure Laterality Date  . NO PAST SURGERIES      Social History:  reports that she has never smoked. She has never used smokeless tobacco. She reports current alcohol use. She reports that she does not use drugs.  Family History: family history includes Healthy in her brother, brother, and mother; Heart failure in her father.   HOME MEDICATIONS: Allergies as of 06/04/2020   No Known Allergies     Medication List        Accurate as of June 03, 2020  3:11 PM. If you have any questions, ask your nurse or doctor.        atenolol 25 MG tablet Commonly known as: TENORMIN Take 1 tablet (25 mg total) by mouth daily.   methimazole 5 MG tablet Commonly known as: TAPAZOLE Take 1 tablet (5 mg total) by mouth daily.         OBJECTIVE:   PHYSICAL EXAM: VS: There were no vitals taken for this visit.   EXAM: General: Pt appears well and is in NAD  Eyes: External eye exam normal with stare R>L, noted with lid lag and  minimal exophthalmos.  EOM intact.    Neck: General: Supple without adenopathy. Thyroid: Thyroid is prominent .  No goiter or nodules appreciated. No thyroid bruit.  Lungs: Clear with good BS bilat with no rales, rhonchi, or wheezes  Heart: Auscultation: RRR.  Abdomen: Normoactive bowel sounds, soft, nontender, without masses or organomegaly palpable  Extremities:  BL LE: No pretibial edema normal ROM and strength.  Mental Status: Judgment, insight: Intact Orientation: Oriented to time, place, and person Mood and affect: No depression, anxiety, or agitation     DATA REVIEWED:  Results for Nancy, Mcconnell (MRN [id number]) as of 06/05/2020 13:03  Ref. 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 - She already has been on Methimazole for almost 2 years. Typically we discussed long term treatment with either RAI ablation vs surgery around this time, but I am concerned about her eyes. We discussed RAI ablation could exacerbate Graves' orbitopathy, I have again encouraged her to have an updated eye exam - AT this time , will continue working on reducing methimazole dose, a few attempts in the past have failed and resulted in subclinical  hyperthyroidism    Medications   Methimazole 5 mg, 1 tablet MOnday - Friday, take Half a tablet on Saturdays and Sundays     2.) Graves' Disease:  - Pt with a stare but no actual symptoms of Graves' orbitopathy , she was seen by ophthalmology in March, 2020. She was advised to use artificial eye drops and have another follow-up soon - She was advised to have an updated exam and have the report faxed to [place].       F/u in 4 months   Labs in 8 weeks  Signed electronically by: Lyndle Herrlich, MD  [facility]  [facility] [address] [place], [place] [address] Phone: [phone] FAX: [phone]      CC: Janeece Agee, NP [address] [place] [address] Phone: [phone]  Fax: [phone]   Return to Endocrinology clinic as below: Future Appointments  Date Time Provider Department Center  06/04/2020  3:40 PM Leyton Brownlee, Konrad Dolores, MD [id number] None

## 2020-06-04 ENCOUNTER — Other Ambulatory Visit (INDEPENDENT_AMBULATORY_CARE_PROVIDER_SITE_OTHER): Payer: 59

## 2020-06-04 ENCOUNTER — Ambulatory Visit (INDEPENDENT_AMBULATORY_CARE_PROVIDER_SITE_OTHER): Payer: 59 | Admitting: Internal Medicine

## 2020-06-04 ENCOUNTER — Other Ambulatory Visit: Payer: Self-pay

## 2020-06-04 VITALS — BP 100/78 | HR 79 | Temp 98.2°F | Wt 151.2 lb

## 2020-06-04 DIAGNOSIS — E0500 Thyrotoxicosis with diffuse goiter without thyrotoxic crisis or storm: Secondary | ICD-10-CM

## 2020-06-04 DIAGNOSIS — E0590 Thyrotoxicosis, unspecified without thyrotoxic crisis or storm: Secondary | ICD-10-CM

## 2020-06-04 LAB — COMPREHENSIVE METABOLIC PANEL
ALT: 6 U/L (ref 0–35)
AST: 13 U/L (ref 0–37)
Albumin: 4.6 g/dL (ref 3.5–5.2)
Alkaline Phosphatase: 52 U/L (ref 39–117)
BUN: 9 mg/dL (ref 6–23)
CO2: 27 mEq/L (ref 19–32)
Calcium: 9.5 mg/dL (ref 8.4–10.5)
Chloride: 105 mEq/L (ref 96–112)
Creatinine, Ser: 0.66 mg/dL (ref 0.40–1.20)
GFR: 132.13 mL/min (ref >60.00)
Glucose, Bld: 92 mg/dL (ref 70–99)
Potassium: 3.8 mEq/L (ref 3.5–5.1)
Sodium: 139 mEq/L (ref 135–145)
Total Bilirubin: 0.6 mg/dL (ref 0.2–1.2)
Total Protein: 7.8 g/dL (ref 6.0–8.3)

## 2020-06-05 LAB — TSH: TSH: 3.75 u[IU]/mL (ref 0.35–4.50)

## 2020-06-05 LAB — T4, FREE: Free T4: 1.02 ng/dL (ref 0.60–1.60)

## 2020-06-05 MED ORDER — METHIMAZOLE 5 MG PO TABS: 5.0000 mg | ORAL_TABLET | ORAL | 1 refills | Status: DC

## 2020-06-19 ENCOUNTER — Other Ambulatory Visit (HOSPITAL_COMMUNITY)
Admission: RE | Admit: 2020-06-19 | Discharge: 2020-06-19 | Disposition: A | Discharge status: Home / Self Care | Payer: 59 | Source: Ambulatory Visit | Attending: Family Medicine | Admitting: Family Medicine

## 2020-06-19 ENCOUNTER — Encounter: Payer: Self-pay | Admitting: Family Medicine

## 2020-06-19 ENCOUNTER — Ambulatory Visit: Payer: 59 | Admitting: Family Medicine

## 2020-06-19 ENCOUNTER — Other Ambulatory Visit: Payer: Self-pay

## 2020-06-19 DIAGNOSIS — H6121 Impacted cerumen, right ear: Secondary | ICD-10-CM

## 2020-06-19 DIAGNOSIS — E0500 Thyrotoxicosis with diffuse goiter without thyrotoxic crisis or storm: Secondary | ICD-10-CM | POA: Diagnosis not present

## 2020-06-19 DIAGNOSIS — A64 Unspecified sexually transmitted disease: Secondary | ICD-10-CM | POA: Insufficient documentation

## 2020-06-19 DIAGNOSIS — Z113 Encounter for screening for infections with a predominantly sexual mode of transmission: Secondary | ICD-10-CM | POA: Diagnosis not present

## 2020-06-19 NOTE — Progress Notes (Signed)
10/5/20214:42 PM  Nancy Mcconnell 11/11/1995, 24 y.o., female [id number]  Chief Complaint  Patient presents with  . SEXUALLY TRANSMITTED DISEASE    just making sure of status  . Ear Problem    feels that ears a compacted  . Referral    2nd opinion for graves dz    HPI:   Patient is a 24 y.o. female with past medical history significant for graves disease who presents today with several concerns  1. Right ear wax - reporting recurring accumulation, having pressure and decreased hearing, usually needs to have removed once a year  2. STD screening - last done in 2019, same partner, nexplanon in place, uses condoms occasionally, denies any known exposures or symptoms  3. Graves disease - currently managed by Dr Lonzo Cloud, had ophtho eval in 2020 - no graves eye disease per endo note, she is requesting referral to second opinion   Depression screen [facility] 2/9 02/06/2020 09/28/2019 09/02/2019  Decreased Interest 0 0 0  Down, Depressed, Hopeless 0 0 0  PHQ - 2 Score 0 0 0    Fall Risk  02/06/2020 09/28/2019 09/02/2019 07/16/2018 03/31/2018  Falls in the past year? 0 0 0 0 No  Number falls in past yr: 0 0 0 - -  Injury with Fall? 0 0 0 - -  Follow up Falls evaluation completed Falls evaluation completed Falls evaluation completed - -     No Known Allergies  Prior to Admission medications   Medication Sig Start Date End Date Taking? Authorizing Provider  atenolol (TENORMIN) 25 MG tablet Take 1 tablet (25 mg total) by mouth daily. 02/01/20   Shamleffer, Konrad Dolores, MD  methimazole (TAPAZOLE) 5 MG tablet Take 1 tablet (5 mg total) by mouth as directed. Half a tablet on Saturdays and Sundays, 1 tablet rest of the week 06/05/20   Shamleffer, Konrad Dolores, MD  traZODone (DESYREL) 50 MG tablet Take 0.5-1 tablets (25-50 mg total) by mouth at bedtime as needed for sleep. 09/28/19 01/11/20  Royal Hawthorn, NP    Past Medical History:  Diagnosis Date  . Graves disease 05/2018  .  Insomnia 09/29/2019  . Tachycardia 05/27/2018    Past Surgical History:  Procedure Laterality Date  . NO PAST SURGERIES      Social History   Tobacco Use  . Smoking status: Never Smoker  . Smokeless tobacco: Never Used  Substance Use Topics  . Alcohol use: Yes    Comment: occ    Family History  Problem Relation Age of Onset  . Healthy Mother   . Heart failure Father   . Healthy Brother   . Healthy Brother     ROS Per hpi  OBJECTIVE:  There were no vitals filed for this visit. There is no height or weight on file to calculate BMI. Vitals not entered by CMA  Physical Exam Vitals and nursing note reviewed.  Constitutional:      Appearance: She is well-developed.  HENT:     Head: Normocephalic and atraumatic.     Right Ear: Tympanic membrane, ear canal and external ear normal. There is impacted cerumen (successfuly lavaged by CMA).     Left Ear: Tympanic membrane, ear canal and external ear normal. There is no impacted cerumen.  Eyes:     General: No scleral icterus.    Conjunctiva/sclera: Conjunctivae normal.     Pupils: Pupils are equal, round, and reactive to light.  Pulmonary:     Effort: Pulmonary effort is normal.  Musculoskeletal:     Cervical back: Neck supple.  Skin:    General: Skin is warm and dry.  Neurological:     Mental Status: She is alert and oriented to person, place, and time.     No results found for this or any previous visit (from the past 24 hour(s)).  No results found.   ASSESSMENT and PLAN  1. Impacted cerumen of right ear Successfully lavaged by CMA  2. Screen for STD (sexually transmitted disease) - Urine cytology ancillary only - HIV Antibody (routine testing w rflx) - RPR  3. Graves disease Discussed with PCP, referral placed as requested by patient - Ambulatory referral to Endocrinology  No follow-ups on file.    Myles Lipps, MD Primary [facility] at [facility] [address] [place], [place] [address] Ph.   [phone] Fax [phone]

## 2020-06-20 LAB — RPR: RPR Ser Ql: NONREACTIVE

## 2020-06-20 LAB — HIV ANTIBODY (ROUTINE TESTING W REFLEX): HIV Screen 4th Generation wRfx: NONREACTIVE

## 2020-06-21 ENCOUNTER — Telehealth: Payer: Self-pay | Admitting: Registered Nurse

## 2020-06-21 LAB — URINE CYTOLOGY ANCILLARY ONLY
Chlamydia: NEGATIVE
Comment: NEGATIVE
Comment: NEGATIVE
Comment: NORMAL
Neisseria Gonorrhea: POSITIVE — AB
Trichomonas: NEGATIVE

## 2020-06-21 NOTE — Telephone Encounter (Signed)
Patient   has reviewed recent lab results on my chart and has pos one on of those test  some Concerns /   Please reach out to patient [phone]

## 2020-06-21 NOTE — Telephone Encounter (Signed)
Pt pos for gonorrhea please advise action

## 2020-06-22 ENCOUNTER — Ambulatory Visit: Payer: 59

## 2020-06-22 ENCOUNTER — Telehealth (INDEPENDENT_AMBULATORY_CARE_PROVIDER_SITE_OTHER): Payer: 59

## 2020-06-22 ENCOUNTER — Other Ambulatory Visit: Payer: Self-pay

## 2020-06-22 DIAGNOSIS — Z202 Contact with and (suspected) exposure to infections with a predominantly sexual mode of transmission: Secondary | ICD-10-CM

## 2020-06-22 DIAGNOSIS — Z113 Encounter for screening for infections with a predominantly sexual mode of transmission: Secondary | ICD-10-CM

## 2020-06-22 MED ORDER — CEFTRIAXONE SODIUM 500 MG IJ SOLR
500.0000 mg | Freq: Once | INTRAMUSCULAR | Status: AC
  Administered 2020-06-22: 500 mg via INTRAMUSCULAR

## 2020-06-22 NOTE — Telephone Encounter (Signed)
Pt came in today for injection for 500 mg rocephin for sti tx. Pt asked if it was ok to consume alcohol, suggested by othr provider that it should be avoided for 24hrs. Also told to contact sexual partner for tx as well. Verbalized understanding

## 2020-06-25 ENCOUNTER — Ambulatory Visit: Payer: 59 | Admitting: Registered Nurse

## 2020-06-28 NOTE — Telephone Encounter (Signed)
There's a result note under the labs from Dr. Leretha Pol detailing treatment plan - looks like Arlys John had called with the details. If she has concerns we can treat her at a visit - if I don't have openings, she can be seen by Dr. Alwyn Ren or Macario Carls if they're available  Thanks!  Jari Sportsman, NP

## 2020-06-29 ENCOUNTER — Other Ambulatory Visit: Payer: Self-pay | Admitting: Internal Medicine

## 2020-07-03 ENCOUNTER — Other Ambulatory Visit: Payer: Self-pay | Admitting: Internal Medicine

## 2020-07-10 IMAGING — DX DG CHEST 2V
2 series · 2 of 2 positions shown · non-contrast
Comparison: None.

CLINICAL DATA: Short of breath

EXAM:
CHEST - 2 VIEW

[chest pa]
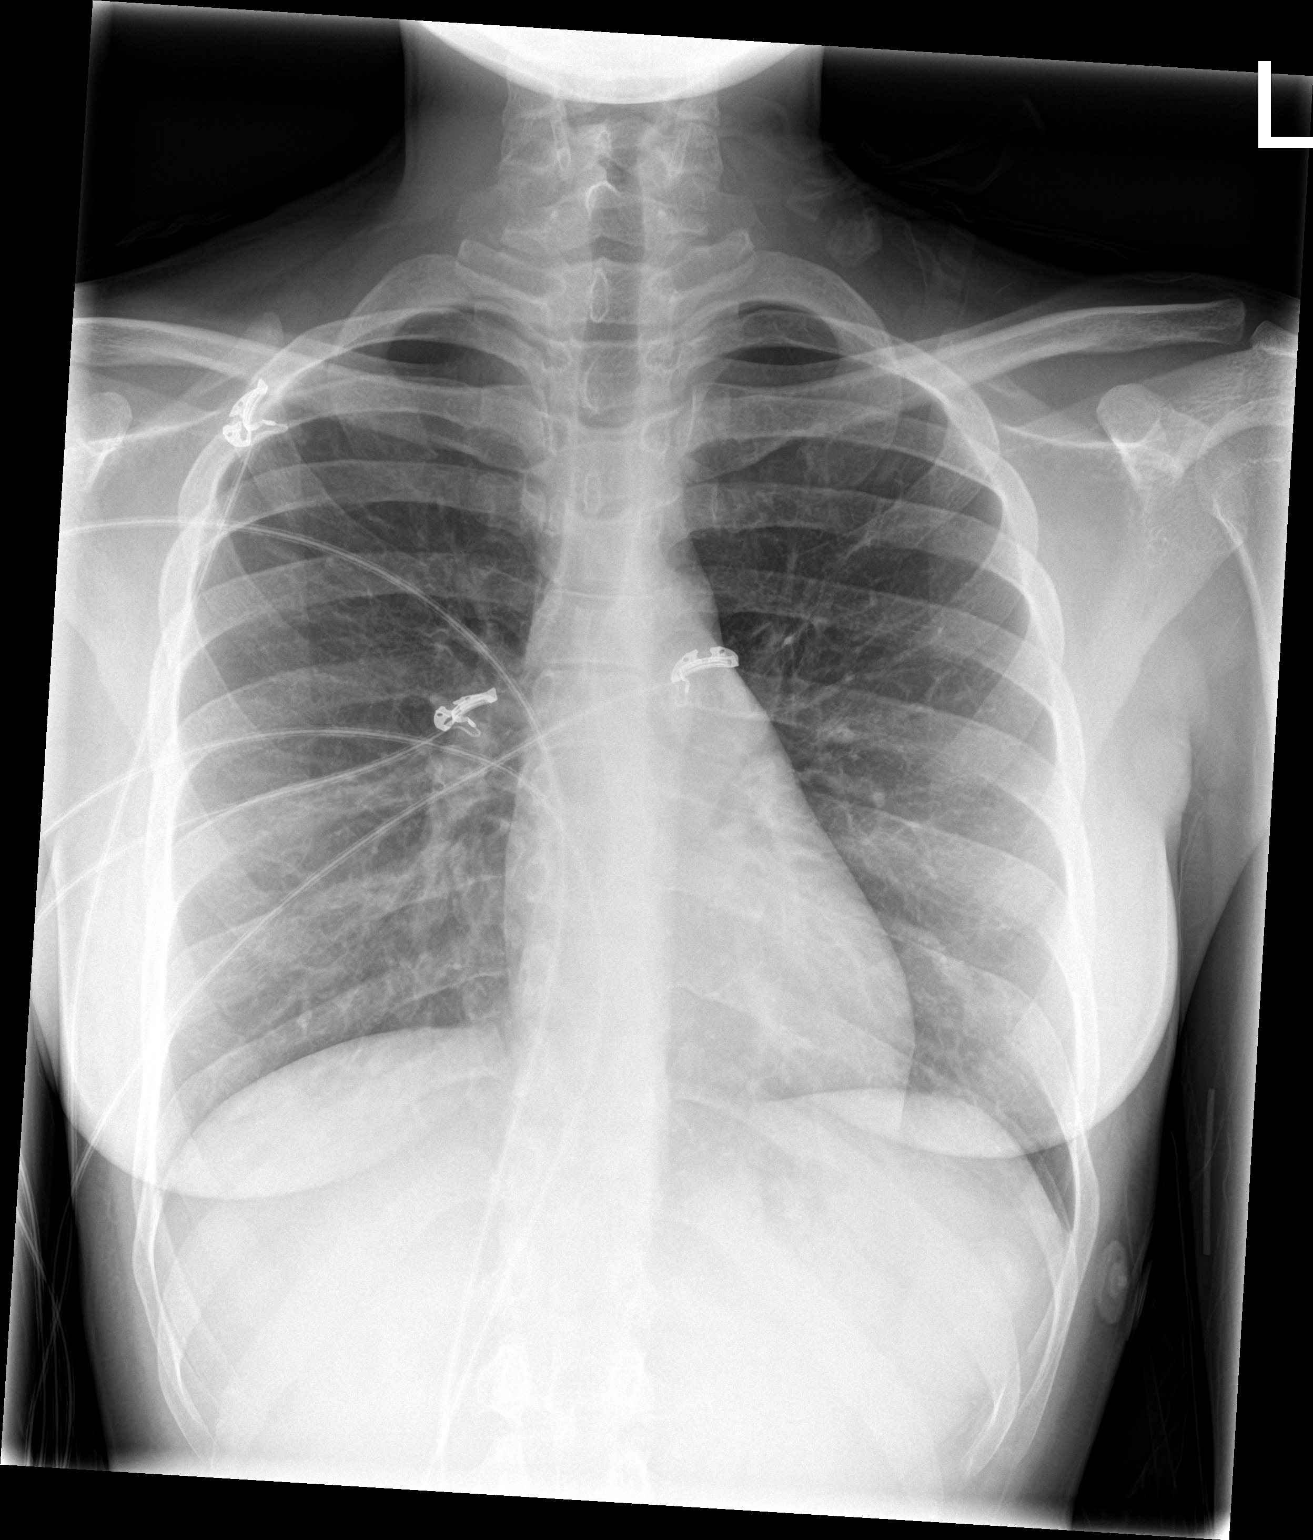

[chest lat]
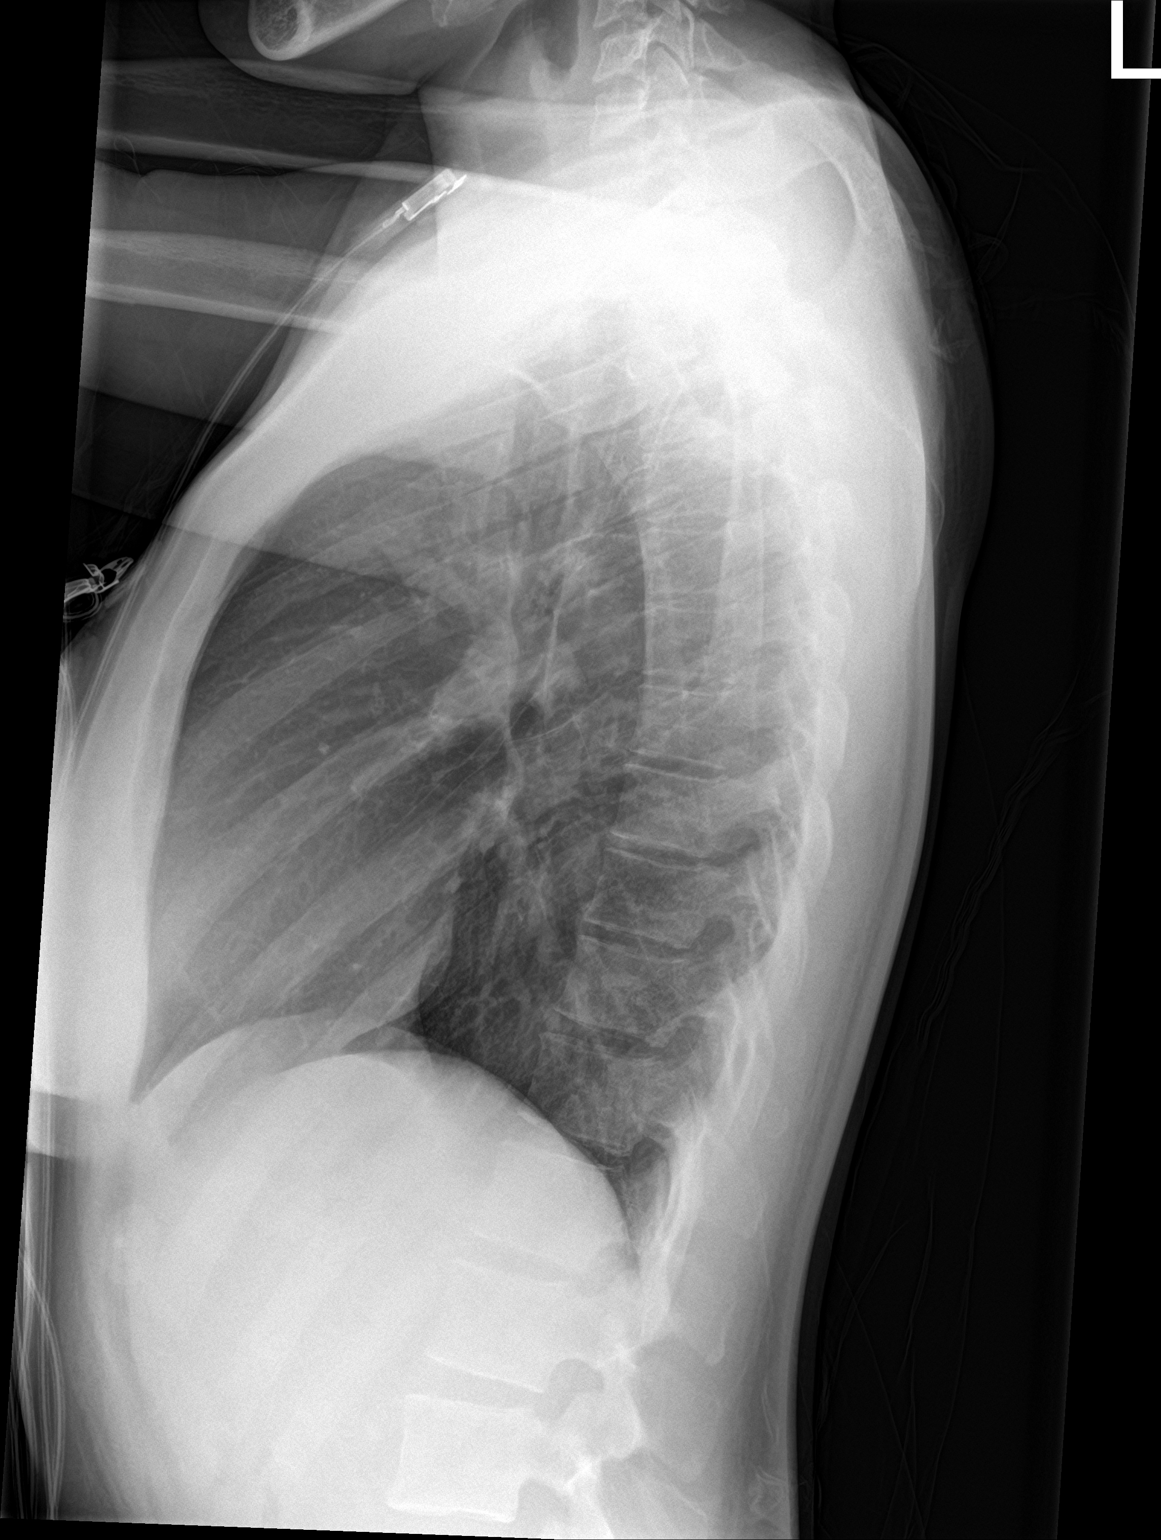

[2 of 2 positions shown; findings below may reference images not displayed]

FINDINGS: The heart size and mediastinal contours are within normal limits.
Both lungs are clear. The visualized skeletal structures are
unremarkable.
IMPRESSION: No active cardiopulmonary disease.

## 2020-07-10 IMAGING — CT CT HEAD W/O CM
3 series · 16 of 47 positions shown, 19 images · non-contrast
Comparison: None.

CLINICAL DATA: Headache, dizziness, near syncope

EXAM:
CT HEAD WITHOUT CONTRAST
TECHNIQUE: Contiguous axial images were obtained from the base of the skull
through the vertex without intravenous contrast.

[Series 2: head wo · axial · 0.41mm/px · z∈[-78,+62]mm · 10 of 34 slices shown, 13 images]
[im 3/34  brain]
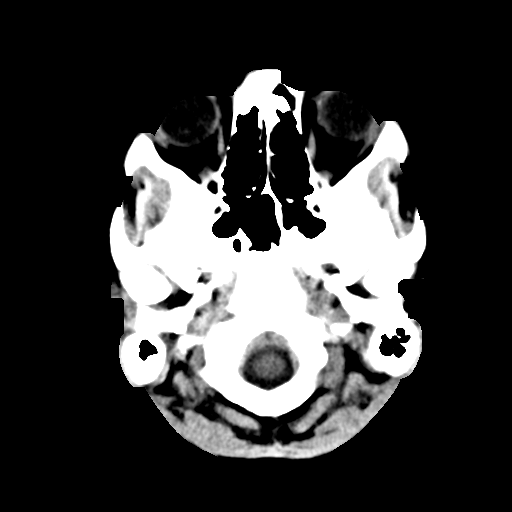
[im 3/34  bone]
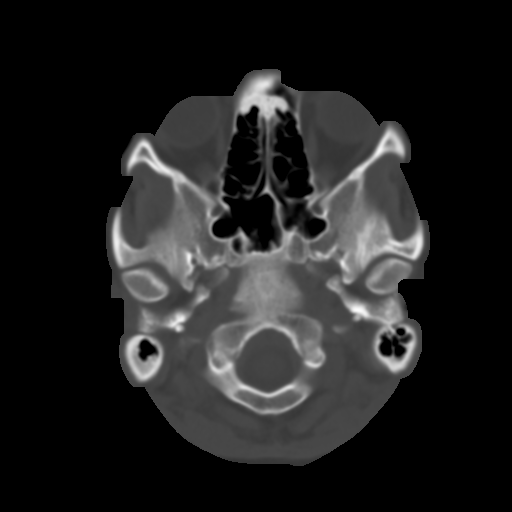
[im 6/34  brain]
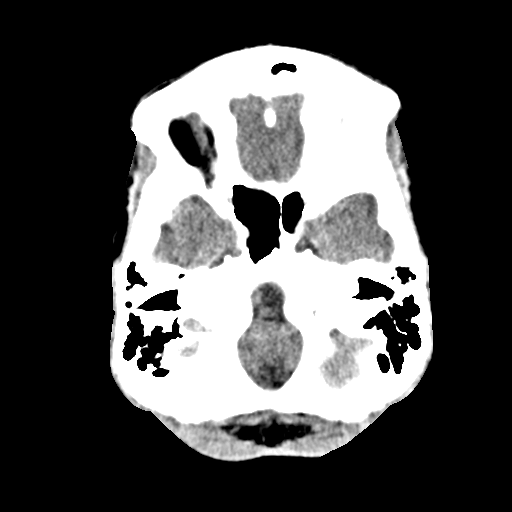
[im 10/34  brain]
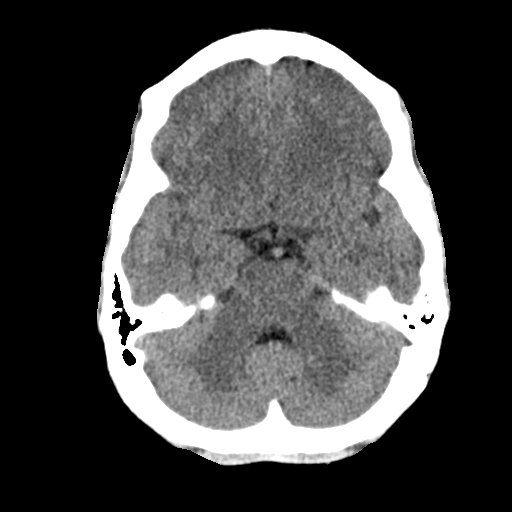
[im 12/34  brain]
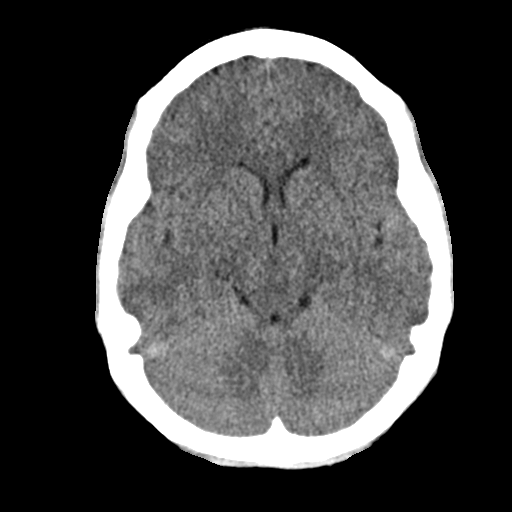
[im 15/34  brain]
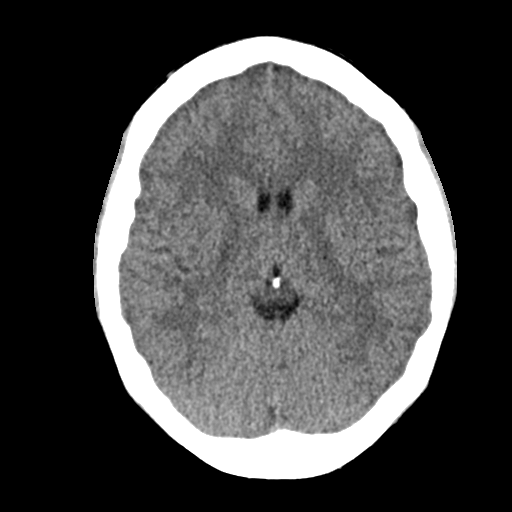
[im 15/34  bone]
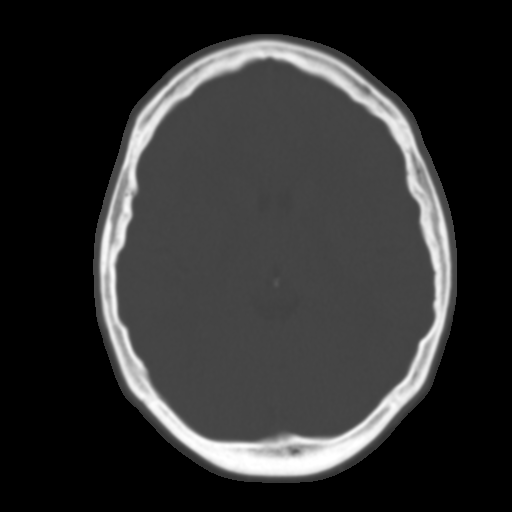
[im 19/34  brain]
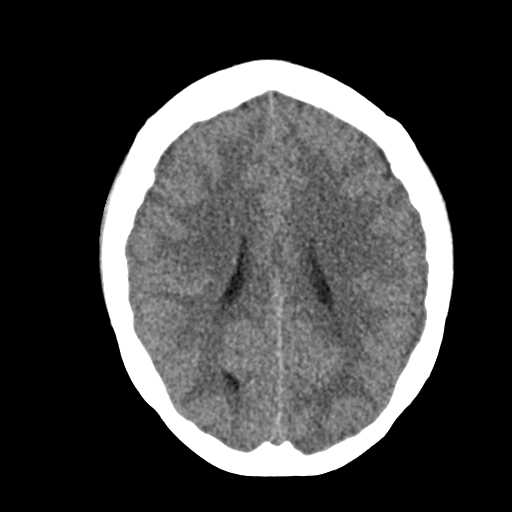
[im 22/34  brain]
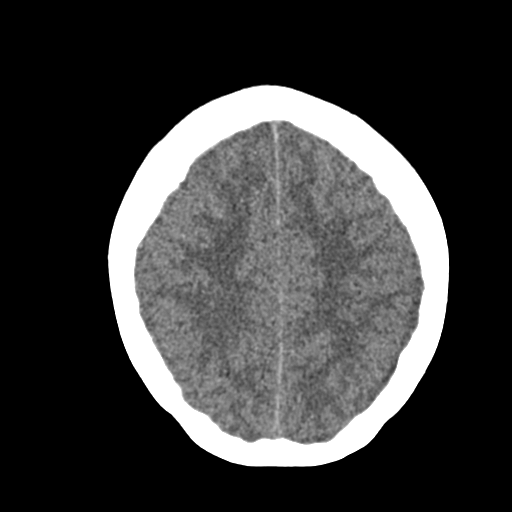
[im 26/34  brain]
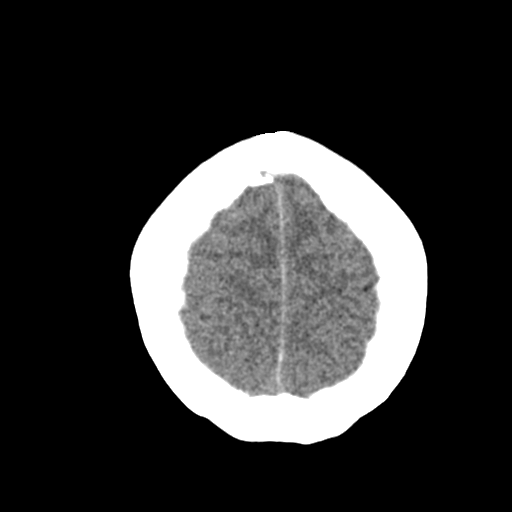
[im 28/34  brain]
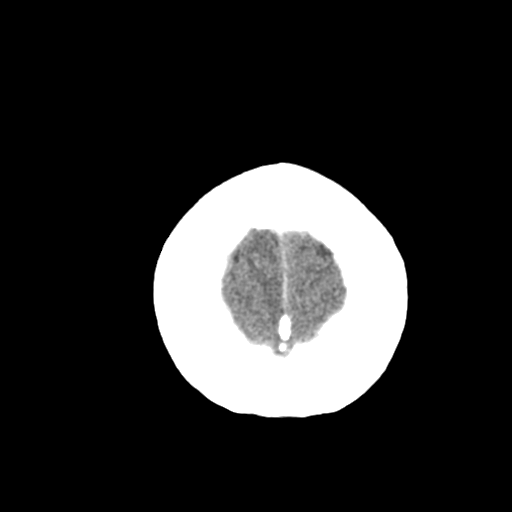
[im 28/34  bone]
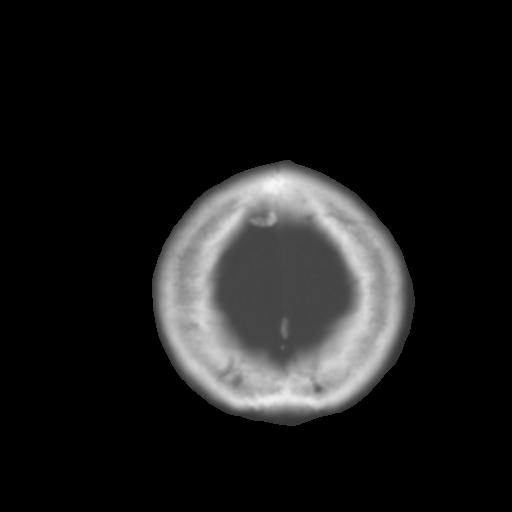
[im 31/34  brain]
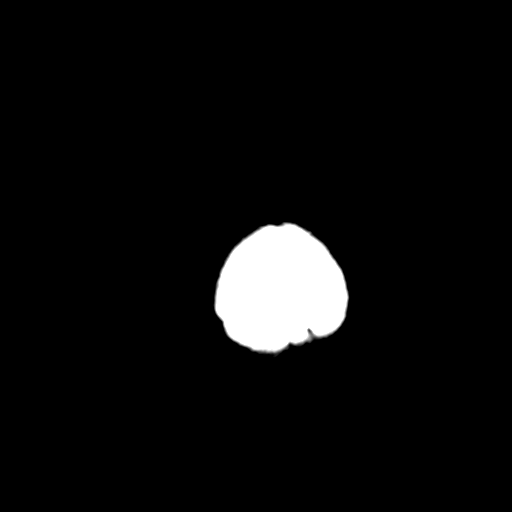

[Series 4: coronal soft · coronal · 0.32mm/px · 3 of 67 slices shown]
[im 23/67  brain]
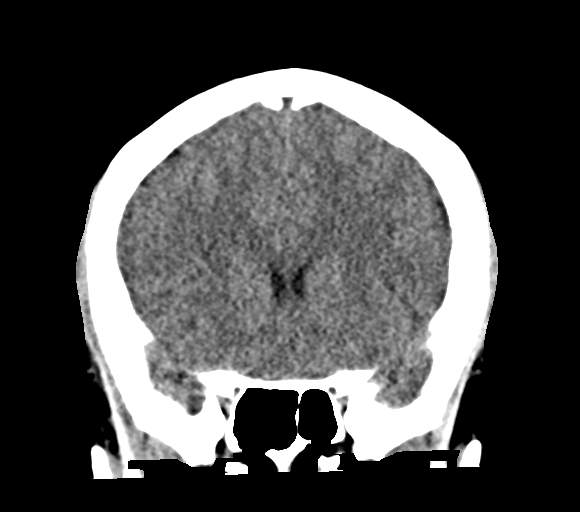
[im 30/67  brain]
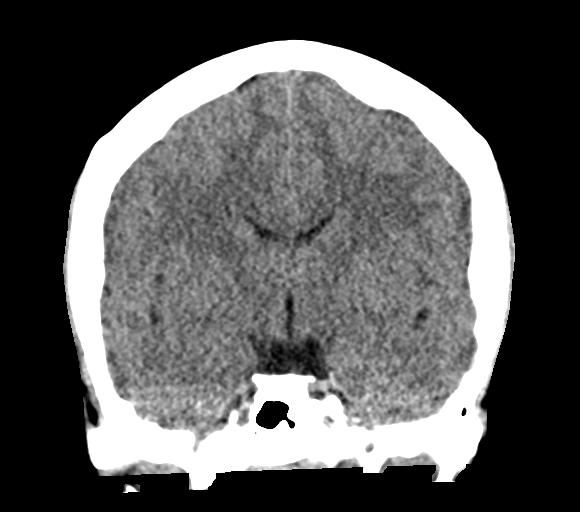
[im 37/67  brain]
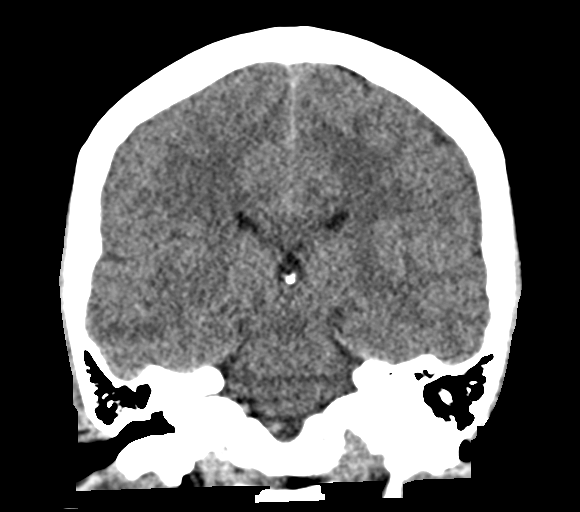

[Series 5: sag soft · sagittal · 0.32mm/px · 3 of 63 slices shown]
[im 21/63  brain]
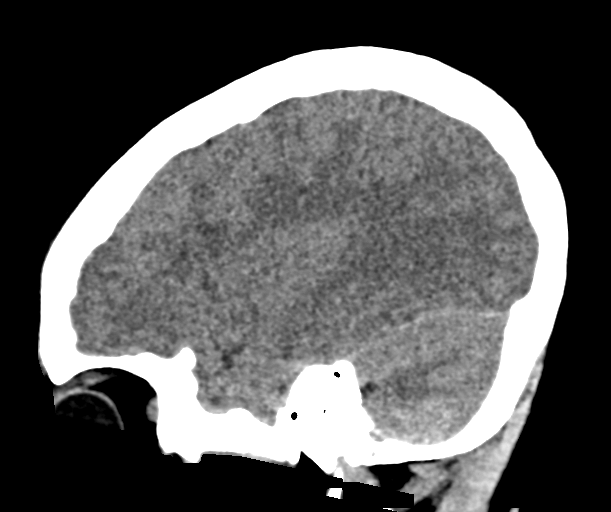
[im 32/63  brain]
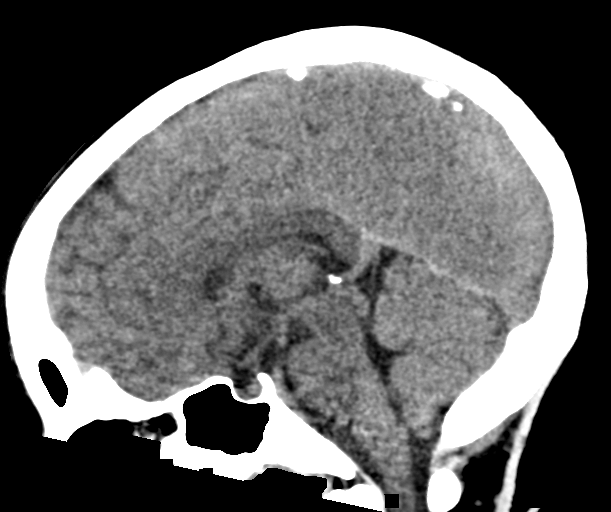
[im 42/63  brain]
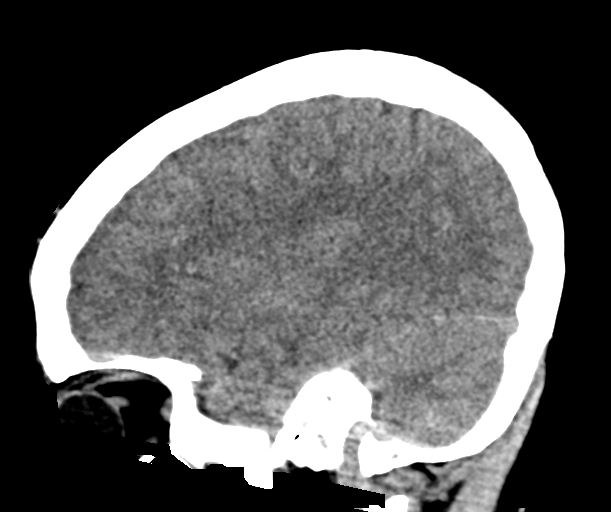

[16 of 47 positions shown; findings below may reference images not displayed]

FINDINGS: Brain: No evidence of acute infarction, hemorrhage, hydrocephalus,
extra-axial collection or mass lesion/mass effect.

Vascular: No hyperdense vessel or unexpected calcification.

Skull: Intact

Sinuses/Orbits: Mastoid air cells are clear. Visualized paranasal
sinuses are clear.

Other: Prominent adenoidal lymphoid tissue.
IMPRESSION: No acute intracranial pathology.

## 2020-07-12 ENCOUNTER — Ambulatory Visit: Payer: 59 | Admitting: Family Medicine

## 2020-07-19 ENCOUNTER — Encounter: Payer: Self-pay | Admitting: Family Medicine

## 2020-07-19 ENCOUNTER — Other Ambulatory Visit (HOSPITAL_COMMUNITY)
Admission: RE | Admit: 2020-07-19 | Discharge: 2020-07-19 | Disposition: A | Discharge status: Home / Self Care | Payer: 59 | Source: Ambulatory Visit | Attending: Family Medicine | Admitting: Family Medicine

## 2020-07-19 ENCOUNTER — Other Ambulatory Visit: Payer: Self-pay

## 2020-07-19 ENCOUNTER — Ambulatory Visit (INDEPENDENT_AMBULATORY_CARE_PROVIDER_SITE_OTHER): Payer: 59 | Admitting: Family Medicine

## 2020-07-19 VITALS — BP 119/76 | HR 77 | Wt 155.4 lb

## 2020-07-19 DIAGNOSIS — Z113 Encounter for screening for infections with a predominantly sexual mode of transmission: Secondary | ICD-10-CM | POA: Diagnosis not present

## 2020-07-19 DIAGNOSIS — Z01419 Encounter for gynecological examination (general) (routine) without abnormal findings: Secondary | ICD-10-CM | POA: Diagnosis not present

## 2020-07-19 DIAGNOSIS — R87612 Low grade squamous intraepithelial lesion on cytologic smear of cervix (LGSIL): Secondary | ICD-10-CM | POA: Diagnosis not present

## 2020-07-19 NOTE — Progress Notes (Signed)
Last pap 05/06/2019 (LSIL) STI testing

## 2020-07-19 NOTE — Addendum Note (Signed)
Addended by: Anell Barr on: 07/19/2020 04:17 PM   Modules accepted: Orders

## 2020-07-19 NOTE — Progress Notes (Signed)
GYNECOLOGY ANNUAL PREVENTATIVE CARE ENCOUNTER NOTE  Subjective:   Nancy Mcconnell is a 24 y.o. No obstetric history on file. female here for a routine annual gynecologic exam.  Current complaints: none.   Denies abnormal vaginal bleeding, discharge, pelvic pain, problems with intercourse or other gynecologic concerns.    Gynecologic History No LMP recorded. Patient is sexually active  Contraception: [name] Last Pap: 2020. Results were: abnormal - LSIL  Obstetric History OB History  No obstetric history on file.    Past Medical History:  Diagnosis Date  . Graves disease 05/2018  . Insomnia 09/29/2019  . Tachycardia 05/27/2018    Past Surgical History:  Procedure Laterality Date  . NO PAST SURGERIES      Current Outpatient Medications on File Prior to Visit  Medication Sig Dispense Refill  . atenolol (TENORMIN) 25 MG tablet Take 1 tablet (25 mg total) by mouth daily. 90 tablet 3  . methimazole (TAPAZOLE) 5 MG tablet TAKE 2 TABLETS BY MOUTH EVERY DAY 180 tablet 3  . [DISCONTINUED] traZODone (DESYREL) 50 MG tablet Take 0.5-1 tablets (25-50 mg total) by mouth at bedtime as needed for sleep. 30 tablet 3   Current Facility-Administered Medications on File Prior to Visit  Medication Dose Route Frequency Provider Last Rate Last Admin  . etonogestrel ([name]) implant 68 mg  68 mg Subdermal Once English, Stephanie D, PA        No Known Allergies  Social History   Socioeconomic History  . Marital status: Single    Spouse name: Not on file  . Number of children: 0  . Years of education: Not on file  . Highest education level: Not on file  Occupational History  . Not on file  Tobacco Use  . Smoking status: Never Smoker  . Smokeless tobacco: Never Used  Vaping Use  . Vaping Use: Never used  Substance and Sexual Activity  . Alcohol use: Yes    Comment: occ  . Drug use: No  . Sexual activity: Yes    Birth control/protection: Implant  Other Topics Concern  .  Not on file  Social History Narrative   ** Merged History Encounter **       Social Determinants of Health   Financial Resource Strain:   . Difficulty of Paying Living Expenses: Not on file  Food Insecurity:   . Worried About Programme researcher, broadcasting/film/video in the Last Year: Not on file  . Ran Out of Food in the Last Year: Not on file  Transportation Needs:   . Lack of Transportation (Medical): Not on file  . Lack of Transportation (Non-Medical): Not on file  Physical Activity:   . Days of Exercise per Week: Not on file  . Minutes of Exercise per Session: Not on file  Stress:   . Feeling of Stress : Not on file  Social Connections:   . Frequency of Communication with Friends and Family: Not on file  . Frequency of Social Gatherings with Friends and Family: Not on file  . Attends Religious Services: Not on file  . Active Member of Clubs or Organizations: Not on file  . Attends Banker Meetings: Not on file  . Marital Status: Not on file  Intimate Partner Violence:   . Fear of Current or Ex-Partner: Not on file  . Emotionally Abused: Not on file  . Physically Abused: Not on file  . Sexually Abused: Not on file    Family History  Problem Relation Age  of Onset  . Healthy Mother   . Heart failure Father   . Healthy Brother   . Healthy Brother     The following portions of the patient's history were reviewed and updated as appropriate: allergies, current medications, past family history, past medical history, past social history, past surgical history and problem list.  Review of Systems Pertinent items are noted in HPI.   Objective:  BP 119/76   Pulse 77   Wt 155 lb 6.4 oz (70.5 kg)   BMI 25.86 kg/m  Wt Readings from Last 3 Encounters:  07/19/20 155 lb 6.4 oz (70.5 kg)  06/04/20 151 lb 3.2 oz (68.6 kg)  03/07/20 154 lb (69.9 kg)     Chaperone present during exam  CONSTITUTIONAL: Well-developed, well-nourished female in no acute distress.  HENT:   Normocephalic, atraumatic, External right and left ear normal. Oropharynx is clear and moist EYES: Conjunctivae and EOM are normal. Pupils are equal, round, and reactive to light. No scleral icterus.  NECK: Normal range of motion, supple, no masses.  Normal thyroid.   CARDIOVASCULAR: Normal heart rate noted, regular rhythm RESPIRATORY: Clear to auscultation bilaterally. Effort and breath sounds normal, no problems with respiration noted. BREASTS: Symmetric in size. No masses, skin changes, nipple drainage, or lymphadenopathy. ABDOMEN: Soft, normal bowel sounds, no distention noted.  No tenderness, rebound or guarding.  PELVIC: Normal appearing external genitalia; normal appearing vaginal mucosa and cervix.  No abnormal discharge noted.  Normal uterine size, no other palpable masses, no uterine or adnexal tenderness. MUSCULOSKELETAL: Normal range of motion. No tenderness.  No cyanosis, clubbing, or edema.  2+ distal pulses. SKIN: Skin is warm and dry. No rash noted. Not diaphoretic. No erythema. No pallor. NEUROLOGIC: Alert and oriented to person, place, and time. Normal reflexes, muscle tone coordination. No cranial nerve deficit noted. PSYCHIATRIC: Normal mood and affect. Normal behavior. Normal judgment and thought content.  Assessment:  Annual gynecologic examination with pap smear   Plan:  1. Well Woman Exam Will follow up results of pap smear and manage accordingly. STD testing discussed. Patient requested testing  2. LGSIL on Pap smear of cervix PAP today  Routine preventative health maintenance measures emphasized. Please refer to After Visit Summary for other counseling recommendations.    Candelaria Celeste, DO [facility] [employer]

## 2020-07-20 ENCOUNTER — Other Ambulatory Visit: Payer: Self-pay | Admitting: Internal Medicine

## 2020-07-20 LAB — HEPATITIS B SURFACE ANTIGEN: Hepatitis B Surface Ag: NEGATIVE

## 2020-07-20 LAB — HEPATITIS C ANTIBODY: Hep C Virus Ab: <0.1 s/co ratio (ref 0.0–0.9)

## 2020-07-20 LAB — HIV ANTIBODY (ROUTINE TESTING W REFLEX): HIV Screen 4th Generation wRfx: NONREACTIVE

## 2020-07-20 LAB — RPR: RPR Ser Ql: NONREACTIVE

## 2020-07-24 LAB — CYTOLOGY - PAP
Chlamydia: NEGATIVE
Comment: NEGATIVE
Comment: NEGATIVE
Comment: NORMAL
Neisseria Gonorrhea: NEGATIVE
Trichomonas: NEGATIVE

## 2020-08-16 ENCOUNTER — Ambulatory Visit: Payer: 59 | Admitting: Family Medicine

## 2020-09-06 ENCOUNTER — Ambulatory Visit: Payer: 59 | Admitting: Family Medicine

## 2020-09-18 ENCOUNTER — Other Ambulatory Visit (HOSPITAL_COMMUNITY)
Admission: RE | Admit: 2020-09-18 | Discharge: 2020-09-18 | Disposition: A | Discharge status: Home / Self Care | Payer: 59 | Source: Ambulatory Visit | Attending: Registered Nurse | Admitting: Registered Nurse

## 2020-09-18 ENCOUNTER — Other Ambulatory Visit: Payer: Self-pay

## 2020-09-18 ENCOUNTER — Encounter: Payer: Self-pay | Admitting: Registered Nurse

## 2020-09-18 ENCOUNTER — Ambulatory Visit: Payer: 59 | Admitting: Registered Nurse

## 2020-09-18 VITALS — BP 114/74 | HR 87 | Temp 98.0°F | Resp 18 | Ht 65.0 in | Wt 154.8 lb

## 2020-09-18 DIAGNOSIS — Z113 Encounter for screening for infections with a predominantly sexual mode of transmission: Secondary | ICD-10-CM

## 2020-09-18 DIAGNOSIS — R4582 Worries: Secondary | ICD-10-CM

## 2020-09-18 NOTE — Patient Instructions (Signed)
° ° ° °  If you have lab work done today you will be contacted with your lab results within the next 2 weeks.  If you have not heard from us then please contact us. The fastest way to get your results is to register for My Chart. ° ° °IF you received an x-ray today, you will receive an invoice from [facility]. Please contact [facility] at [phone] with questions or concerns regarding your invoice.  ° °IF you received labwork today, you will receive an invoice from [facility]. Please contact [facility] at [phone] with questions or concerns regarding your invoice.  ° °Our billing staff will not be able to assist you with questions regarding bills from these companies. ° °You will be contacted with the lab results as soon as they are available. The fastest way to get your results is to activate your My Chart account. Instructions are located on the last page of this paperwork. If you have not heard from us regarding the results in 2 weeks, please contact this office. °  ° ° ° °

## 2020-09-18 NOTE — Progress Notes (Signed)
Established Patient Office Visit  Subjective:  Patient ID: Nancy Mcconnell, female    DOB: August 03, 1996  Age: 25 y.o. MRN: [id number]  CC:  Chief Complaint  Patient presents with  . std testing    Patient states she would like to get a std screening. Per patient she denies having any symptoms, but just want to be tested.    HPI [name] presents for std screen  No known exposure Hx of gonorrhea, tx, no ongoing symptoms  Otherwise feeling well Has some questions about transmission and prophylaxis though no acute concerns  Past Medical History:  Diagnosis Date  . Graves disease 05/2018  . Insomnia 09/29/2019  . Tachycardia 05/27/2018    Past Surgical History:  Procedure Laterality Date  . NO PAST SURGERIES      Family History  Problem Relation Age of Onset  . Healthy Mother   . Heart failure Father   . Healthy Brother   . Healthy Brother     Social History   Socioeconomic History  . Marital status: Single    Spouse name: Not on file  . Number of children: 0  . Years of education: Not on file  . Highest education level: Not on file  Occupational History  . Not on file  Tobacco Use  . Smoking status: Never Smoker  . Smokeless tobacco: Never Used  Vaping Use  . Vaping Use: Never used  Substance and Sexual Activity  . Alcohol use: Yes    Comment: occ  . Drug use: No  . Sexual activity: Yes    Birth control/protection: Implant  Other Topics Concern  . Not on file  Social History Narrative   ** Merged History Encounter **       Social Determinants of Health   Financial Resource Strain: Not on file  Food Insecurity: Not on file  Transportation Needs: Not on file  Physical Activity: Not on file  Stress: Not on file  Social Connections: Not on file  Intimate Partner Violence: Not on file    Outpatient Medications Prior to Visit  Medication Sig Dispense Refill  . atenolol (TENORMIN) 25 MG tablet Take 1 tablet (25 mg total) by mouth daily.  90 tablet 3  . methimazole (TAPAZOLE) 5 MG tablet TAKE 2 TABLETS BY MOUTH EVERY DAY 180 tablet 3   Facility-Administered Medications Prior to Visit  Medication Dose Route Frequency Provider Last Rate Last Admin  . etonogestrel ([name]) implant 68 mg  68 mg Subdermal Once English, Stephanie D, PA        No Known Allergies  ROS Review of Systems  Constitutional: Negative.   HENT: Negative.   Eyes: Negative.   Respiratory: Negative.   Cardiovascular: Negative.   Gastrointestinal: Negative.   Genitourinary: Negative.   Musculoskeletal: Negative.   Skin: Negative.   Neurological: Negative.   Psychiatric/Behavioral: Negative.   All other systems reviewed and are negative.     Objective:    Physical Exam Vitals and nursing note reviewed.  Constitutional:      General: She is not in acute distress.    Appearance: Normal appearance. She is normal weight. She is not ill-appearing, toxic-appearing or diaphoretic.  Cardiovascular:     Rate and Rhythm: Normal rate and regular rhythm.     Heart sounds: Normal heart sounds. No murmur heard. No friction rub. No gallop.   Pulmonary:     Effort: Pulmonary effort is normal. No respiratory distress.     Breath sounds: Normal  breath sounds. No stridor. No wheezing, rhonchi or rales.  Chest:     Chest wall: No tenderness.  Skin:    General: Skin is warm and dry.  Neurological:     General: No focal deficit present.     Mental Status: She is alert and oriented to person, place, and time. Mental status is at baseline.  Psychiatric:        Mood and Affect: Mood normal.        Behavior: Behavior normal.        Thought Content: Thought content normal.        Judgment: Judgment normal.     BP 114/74   Pulse 87   Temp 98 F (36.7 C) (Temporal)   Resp 18   Ht 5\' 5"  (1.651 m)   Wt 154 lb 12.8 oz (70.2 kg)   LMP 09/09/2020   SpO2 97%   BMI 25.76 kg/m  Wt Readings from Last 3 Encounters:  09/18/20 154 lb 12.8 oz (70.2 kg)   07/19/20 155 lb 6.4 oz (70.5 kg)  06/04/20 151 lb 3.2 oz (68.6 kg)     There are no preventive care reminders to display for this patient.  There are no preventive care reminders to display for this patient.  Lab Results  Component Value Date   TSH 3.75 06/04/2020   Lab Results  Component Value Date   WBC 7.0 02/06/2020   HGB 13.2 02/06/2020   HCT 39.4 02/06/2020   MCV 85.1 02/06/2020   PLT 268.0 10/05/2019   Lab Results  Component Value Date   NA 139 06/04/2020   K 3.8 06/04/2020   CO2 27 06/04/2020   GLUCOSE 92 06/04/2020   BUN 9 06/04/2020   CREATININE 0.66 06/04/2020   BILITOT 0.6 06/04/2020   ALKPHOS 52 06/04/2020   AST 13 06/04/2020   ALT 6 06/04/2020   PROT 7.8 06/04/2020   ALBUMIN 4.6 06/04/2020   CALCIUM 9.5 06/04/2020   ANIONGAP 7 05/28/2018   GFR 132.13 06/04/2020   No results found for: CHOL No results found for: HDL No results found for: LDLCALC No results found for: TRIG No results found for: CHOLHDL Lab Results  Component Value Date   HGBA1C 5.2 02/06/2020      Assessment & Plan:   Problem List Items Addressed This Visit   None   Visit Diagnoses    Screen for STD (sexually transmitted disease)    -  Primary   Relevant Orders   Urine cytology ancillary only   RPR   HIV antibody (with reflex)   Worries       Relevant Orders   Urine cytology ancillary only   RPR   HIV antibody (with reflex)      No orders of the defined types were placed in this encounter.   Follow-up: No follow-ups on file.   PLAN  Screening collected  Will follow up as warranted  Discussed safe sex  Patient encouraged to call clinic with any questions, comments, or concerns.  02/08/2020, NP

## 2020-09-19 LAB — RPR: RPR Ser Ql: NONREACTIVE

## 2020-09-19 LAB — HIV ANTIBODY (ROUTINE TESTING W REFLEX): HIV Screen 4th Generation wRfx: NONREACTIVE

## 2020-09-20 LAB — URINE CYTOLOGY ANCILLARY ONLY
Chlamydia: NEGATIVE
Comment: NEGATIVE
Comment: NEGATIVE
Comment: NORMAL
Neisseria Gonorrhea: NEGATIVE
Trichomonas: NEGATIVE

## 2020-09-27 ENCOUNTER — Ambulatory Visit: Payer: 59 | Admitting: Family Medicine

## 2020-10-05 ENCOUNTER — Ambulatory Visit: Payer: 59 | Admitting: Internal Medicine

## 2020-10-05 NOTE — Progress Notes (Deleted)
Name: Nancy Mcconnell  MRN/ DOB: [id number], 03-19-1996    Age/ Sex: 25 y.o., female     PCP: Janeece Agee, NP   Reason for Endocrinology Evaluation: Hyperthyroidism     Initial Endocrinology Clinic Visit: 08/02/18    PATIENT IDENTIFIER: Nancy Mcconnell is a 25 y.o., female with a past medical history of Hyperthyrodidism. She has followed with [facility] since 08/02/18 for consultative assistance with management of her Hyperthyroidism.   HISTORICAL SUMMARY: Pt presented to the ED on 05/27/18 with c/o dizziness, nausea and vomiting. She was found to be tachycardic with abnormal liver enzymes. Labs revealed suppressed TSH at < 0.010 uIU/mL with elevated FT4. She was started on Methimazole in September 2019  An attempt to reduce her methimazole to 5 mg daily in January 2020 resulted in hyperthyroidism.  Cousin passed away with Lupus.    She is studying for [employer]   SUBJECTIVE:   Today (10/05/2020):  Nancy Mcconnell is here for 4 month follow up on hyperthyroidism . Pt on methimazole daily    She has not been  on 2.5 mg on saturdays and sundays and 1 tab rest of the week as advised back in May. , She continues with taking 1 tablet daily  Denies  diarrhea or constipation  Has occasional dysphagia Continue with fatigue and anxiety      HISTORY:  Past Medical History:  Past Medical History:  Diagnosis Date  . Graves disease 05/2018  . Insomnia 09/29/2019  . Tachycardia 05/27/2018   Past Surgical History:  Past Surgical History:  Procedure Laterality Date  . NO PAST SURGERIES      Social History:  reports that she has never smoked. She has never used smokeless tobacco. She reports current alcohol use. She reports that she does not use drugs.  Family History: family history includes Healthy in her brother, brother, and mother; Heart failure in her father.   HOME MEDICATIONS: Allergies as of 10/05/2020   No Known Allergies     Medication List        Accurate as of October 05, 2020 10:06 AM. If you have any questions, ask your nurse or doctor.        atenolol 25 MG tablet Commonly known as: TENORMIN Take 1 tablet (25 mg total) by mouth daily.   methimazole 5 MG tablet Commonly known as: TAPAZOLE TAKE 2 TABLETS BY MOUTH EVERY DAY         OBJECTIVE:   PHYSICAL EXAM: VS: LMP 09/09/2020    EXAM: General: Pt appears well and is in NAD  Eyes: External eye exam normal with stare R>L, noted with lid lag and  minimal exophthalmos.  EOM intact.    Neck: General: Supple without adenopathy. Thyroid: Thyroid is prominent .  No goiter or nodules appreciated. No thyroid bruit.  Lungs: Clear with good BS bilat with no rales, rhonchi, or wheezes  Heart: Auscultation: RRR.  Abdomen: Normoactive bowel sounds, soft, nontender, without masses or organomegaly palpable  Extremities:  BL LE: No pretibial edema normal ROM and strength.  Mental Status: Judgment, insight: Intact Orientation: Oriented to time, place, and person Mood and affect: No depression, anxiety, or agitation     DATA REVIEWED:  Results for MALALA, TRENKAMP (MRN [id number]) as of 06/05/2020 13:03  Ref. 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 - She already has been on Methimazole for almost 2 years. Typically we discussed long term treatment with either RAI ablation vs surgery around this time, but I am concerned about her eyes. We discussed RAI ablation could exacerbate Graves' orbitopathy, I have again encouraged her to have an updated eye exam - AT this time , will continue working on reducing methimazole dose, a few attempts in the past have failed and resulted in subclinical hyperthyroidism    Medications    Methimazole 5 mg, 1 tablet MOnday - Friday, take Half a tablet on Saturdays and Sundays     2.) Graves' Disease:  - Pt with a stare but no actual symptoms of Graves' orbitopathy , she was seen by ophthalmology in March, 2020. She was advised to use artificial eye drops and have another follow-up soon - She was advised to have an updated exam and have the report faxed to [place].       F/u in 4 months   Labs in 8 weeks  Signed electronically by: Lyndle Herrlich, MD  [facility]  [facility] [address] [place], [place] [address] Phone: [phone] FAX: [phone]      CC: Janeece Agee, NP [address] [place] [address] Phone: [phone]  Fax: [phone]   Return to Endocrinology clinic as below: Future Appointments  Date Time Provider Department Center  10/05/2020  2:00 PM Alfonzo Arca, Konrad Dolores, MD [facility] None  10/18/2020  1:00 PM Levie Heritage, DO [facility] None

## 2020-10-18 ENCOUNTER — Ambulatory Visit: Payer: 59 | Admitting: Family Medicine

## 2020-10-18 ENCOUNTER — Telehealth: Payer: Self-pay | Admitting: General Practice

## 2020-10-18 NOTE — Telephone Encounter (Signed)
Pt left message to reschedule appt that was scheduled for today at 1pm.  Called patient to reschedule, but no answer.  Left message on VM for patient to give our office a call to reschedule at her earliest convenience.

## 2020-11-02 ENCOUNTER — Ambulatory Visit: Payer: 59 | Admitting: Internal Medicine

## 2020-11-02 ENCOUNTER — Encounter: Payer: Self-pay | Admitting: Internal Medicine

## 2020-11-02 NOTE — Progress Notes (Deleted)
Name: Nancy Mcconnell  MRN/ DOB: [id number], 09/30/1995    Age/ Sex: 25 y.o., female     PCP: Janeece Agee, NP   Reason for Endocrinology Evaluation: Hyperthyroidism     Initial Endocrinology Clinic Visit: 08/02/18    PATIENT IDENTIFIER: Nancy Mcconnell is a 25 y.o., female with a past medical history of Hyperthyrodidism. She has followed with [facility] since 08/02/18 for consultative assistance with management of her Hyperthyroidism.   HISTORICAL SUMMARY: Pt presented to the ED on 05/27/18 with c/o dizziness, nausea and vomiting. She was found to be tachycardic with abnormal liver enzymes. Labs revealed suppressed TSH at < 0.010 uIU/mL with elevated FT4. She was started on Methimazole in September 2019  An attempt to reduce her methimazole to 5 mg daily in January 2020 resulted in hyperthyroidism.  Cousin passed away with Lupus.    She is studying for [employer]   SUBJECTIVE:   Today (11/02/2020):  Nancy Mcconnell is here for 4 month follow up on hyperthyroidism . Pt on methimazole daily   Denies  diarrhea or constipation  Has occasional dysphagia Continue with fatigue and anxiety    Methimazole 5 mg, 1 tablet MOnday - Friday, take Half a tablet on Saturdays and Sundays      HISTORY:  Past Medical History:  Past Medical History:  Diagnosis Date  . Graves disease 05/2018  . Insomnia 09/29/2019  . Tachycardia 05/27/2018   Past Surgical History:  Past Surgical History:  Procedure Laterality Date  . NO PAST SURGERIES      Social History:  reports that she has never smoked. She has never used smokeless tobacco. She reports current alcohol use. She reports that she does not use drugs.  Family History: family history includes Healthy in her brother, brother, and mother; Heart failure in her father.   HOME MEDICATIONS: Allergies as of 11/02/2020   No Known Allergies     Medication List       Accurate as of November 02, 2020  7:06 AM. If you have  any questions, ask your nurse or doctor.        atenolol 25 MG tablet Commonly known as: TENORMIN Take 1 tablet (25 mg total) by mouth daily.   methimazole 5 MG tablet Commonly known as: TAPAZOLE TAKE 2 TABLETS BY MOUTH EVERY DAY         OBJECTIVE:   PHYSICAL EXAM: VS: There were no vitals taken for this visit.   EXAM: General: Pt appears well and is in NAD  Eyes: External eye exam normal with stare R>L, noted with lid lag and  minimal exophthalmos.  EOM intact.    Neck: General: Supple without adenopathy. Thyroid: Thyroid is prominent .  No goiter or nodules appreciated. No thyroid bruit.  Lungs: Clear with good BS bilat with no rales, rhonchi, or wheezes  Heart: Auscultation: RRR.  Abdomen: Normoactive bowel sounds, soft, nontender, without masses or organomegaly palpable  Extremities:  BL LE: No pretibial edema normal ROM and strength.  Mental Status: Judgment, insight: Intact Orientation: Oriented to time, place, and person Mood and affect: No depression, anxiety, or agitation     DATA REVIEWED:  Results for Nancy Mcconnell (MRN [id number]) as of 06/05/2020 13:03  Ref. Range 06/04/2020 16:57  TSH Latest Ref Range: 0.35 - 4.50 uIU/mL 3.75  T4,Free(Direct) Latest Ref Range: 0.60 - 1.60 ng/dL 2.87      ASSESSMENT / PLAN / RECOMMENDATIONS:   1) Hyperthyroidism Secondary to Graves' Disease :  -  She is clinically euthyroid - She denies any local neck symptoms. - She already has been on Methimazole for almost 2 years. Typically we discussed long term treatment with either RAI ablation vs surgery around this time, but I am concerned about her eyes. We discussed RAI ablation could exacerbate Graves' orbitopathy, I have again encouraged her to have an updated eye exam - AT this time , will continue working on reducing methimazole dose, a few attempts in the past have failed and resulted in subclinical hyperthyroidism    Medications   Methimazole 5 mg, 1 tablet  MOnday - Friday, take Half a tablet on Saturdays and Sundays     2.) Graves' Disease:  - Pt with a stare but no actual symptoms of Graves' orbitopathy , she was seen by ophthalmology in March, 2020. She was advised to use artificial eye drops and have another follow-up soon - She was advised to have an updated exam and have the report faxed to [place].       F/u in 4 months   Labs in 8 weeks  Signed electronically by: Lyndle Herrlich, MD  [facility]  [facility] [address] [place], [place] [address] Phone: [phone] FAX: [phone]      CC: Janeece Agee, NP [address] [place] [address] Phone: [phone]  Fax: [phone]   Return to Endocrinology clinic as below: Future Appointments  Date Time Provider Department Center  11/02/2020 10:50 AM Jad Johansson, Konrad Dolores, MD [facility] None  11/08/2020  2:30 PM Levie Heritage, DO [facility] None

## 2020-11-05 ENCOUNTER — Ambulatory Visit: Payer: 59 | Admitting: Internal Medicine

## 2020-11-08 ENCOUNTER — Ambulatory Visit: Payer: 59 | Admitting: Family Medicine

## 2020-11-19 ENCOUNTER — Encounter: Payer: Self-pay | Admitting: Internal Medicine

## 2020-11-19 ENCOUNTER — Other Ambulatory Visit: Payer: Self-pay

## 2020-11-19 ENCOUNTER — Other Ambulatory Visit (INDEPENDENT_AMBULATORY_CARE_PROVIDER_SITE_OTHER): Payer: 59

## 2020-11-19 ENCOUNTER — Ambulatory Visit (INDEPENDENT_AMBULATORY_CARE_PROVIDER_SITE_OTHER): Payer: 59 | Admitting: Internal Medicine

## 2020-11-19 VITALS — BP 114/72 | HR 89 | Ht 65.0 in | Wt 157.0 lb

## 2020-11-19 DIAGNOSIS — E0500 Thyrotoxicosis with diffuse goiter without thyrotoxic crisis or storm: Secondary | ICD-10-CM

## 2020-11-19 DIAGNOSIS — E0590 Thyrotoxicosis, unspecified without thyrotoxic crisis or storm: Secondary | ICD-10-CM

## 2020-11-19 LAB — COMPREHENSIVE METABOLIC PANEL
ALT: 5 U/L (ref 0–35)
AST: 12 U/L (ref 0–37)
Albumin: 4.2 g/dL (ref 3.5–5.2)
Alkaline Phosphatase: 45 U/L (ref 39–117)
BUN: 12 mg/dL (ref 6–23)
CO2: 26 mEq/L (ref 19–32)
Calcium: 9.3 mg/dL (ref 8.4–10.5)
Chloride: 105 mEq/L (ref 96–112)
Creatinine, Ser: 0.69 mg/dL (ref 0.40–1.20)
GFR: 120.66 mL/min (ref >60.00)
Glucose, Bld: 86 mg/dL (ref 70–99)
Potassium: 3.5 mEq/L (ref 3.5–5.1)
Sodium: 138 mEq/L (ref 135–145)
Total Bilirubin: 0.3 mg/dL (ref 0.2–1.2)
Total Protein: 7.1 g/dL (ref 6.0–8.3)

## 2020-11-19 LAB — CBC WITH DIFFERENTIAL/PLATELET
Basophils Absolute: 0.1 10*3/uL (ref 0.0–0.1)
Basophils Relative: 0.9 % (ref 0.0–3.0)
Eosinophils Absolute: 0.2 10*3/uL (ref 0.0–0.7)
Eosinophils Relative: 2.5 % (ref 0.0–5.0)
HCT: 35.3 % — ABNORMAL LOW (ref 36.0–46.0)
Hemoglobin: 12.1 g/dL (ref 12.0–15.0)
Lymphocytes Relative: 28.8 % (ref 12.0–46.0)
Lymphs Abs: 2.1 10*3/uL (ref 0.7–4.0)
MCHC: 34.3 g/dL (ref 30.0–36.0)
MCV: 80.9 fl (ref 78.0–100.0)
Monocytes Absolute: 0.8 10*3/uL (ref 0.1–1.0)
Monocytes Relative: 10.4 % (ref 3.0–12.0)
Neutro Abs: 4.2 10*3/uL (ref 1.4–7.7)
Neutrophils Relative %: 57.4 % (ref 43.0–77.0)
Platelets: 248 10*3/uL (ref 150.0–400.0)
RBC: 4.36 Mil/uL (ref 3.87–5.11)
RDW: 14.1 % (ref 11.5–15.5)
WBC: 7.4 10*3/uL (ref 4.0–10.5)

## 2020-11-19 LAB — TSH: TSH: 6.25 u[IU]/mL — ABNORMAL HIGH (ref 0.35–4.50)

## 2020-11-19 LAB — T4, FREE: Free T4: 1.03 ng/dL (ref 0.60–1.60)

## 2020-11-19 MED ORDER — METHIMAZOLE 5 MG PO TABS: 2.5000 mg | ORAL_TABLET | Freq: Every day | ORAL | 1 refills | Status: DC

## 2020-11-19 NOTE — Progress Notes (Signed)
Name: [name]  MRN/ DOB: [id number], 01-07-1996    Age/ Sex: 25 y.o., female     PCP: Janeece Agee, NP   Reason for Endocrinology Evaluation: Hyperthyroidism     Initial Endocrinology Clinic Visit: 08/02/18    PATIENT IDENTIFIER: [name] is a 25 y.o., female with a past medical history of Hyperthyrodidism. She has followed with [facility] since 08/02/18 for consultative assistance with management of her Hyperthyroidism.   HISTORICAL SUMMARY: Pt presented to the ED on 05/27/18 with c/o dizziness, nausea and vomiting. She was found to be tachycardic with abnormal liver enzymes. Labs revealed suppressed TSH at < 0.010 uIU/mL with elevated FT4. She was started on Methimazole in September 2019  An attempt to reduce her methimazole to 5 mg daily in January 2020 resulted in hyperthyroidism.  Cousin passed away with Lupus.    She is studying for [employer]   SUBJECTIVE:   Today (11/19/2020):  [name] is here for 4 month follow up on hyperthyroidism . Pt on methimazole daily   She has been having vomiting for the past week  Denies heartburn  Denies diarrhea or constipation  Denies local neck symptoms    Methimazole 5 mg, 1 tablet MOnday - Friday, take Half a tablet on Saturdays and Sundays.  Pt has been taking 1 tablet daily  though     HISTORY:  Past Medical History:  Past Medical History:  Diagnosis Date  . Graves disease 05/2018  . Insomnia 09/29/2019  . Tachycardia 05/27/2018   Past Surgical History:  Past Surgical History:  Procedure Laterality Date  . NO PAST SURGERIES      Social History:  reports that she has never smoked. She has never used smokeless tobacco. She reports current alcohol use. She reports that she does not use drugs.  Family History: family history includes Healthy in her brother, brother, and mother; Heart failure in her father.   HOME MEDICATIONS: Allergies as of 11/19/2020   No Known Allergies      Medication List       Accurate as of November 19, 2020  9:23 AM. If you have any questions, ask your nurse or doctor.        atenolol 25 MG tablet Commonly known as: TENORMIN Take 1 tablet (25 mg total) by mouth daily.   methimazole 5 MG tablet Commonly known as: TAPAZOLE TAKE 2 TABLETS BY MOUTH EVERY DAY         OBJECTIVE:   PHYSICAL EXAM: VS: BP 114/72   Pulse 89   Ht 5\' 5"  (1.651 m)   Wt 157 lb (71.2 kg)   LMP 10/28/2020   SpO2 99%   BMI 26.13 kg/m    EXAM: General: Pt appears well and is in NAD  Eyes: External eye exam normal with a stare , no lid lag .  EOM intact.    Neck: General: Supple without adenopathy. Thyroid: Thyroid is prominent .  No goiter or nodules appreciated. No thyroid bruit.  Lungs: Clear with good BS bilat with no rales, rhonchi, or wheezes  Heart: Auscultation: RRR.  Abdomen: Normoactive bowel sounds, soft, nontender, without masses or organomegaly palpable  Extremities:  BL LE: No pretibial edema normal ROM and strength.  Mental Status: Judgment, insight: Intact Orientation: Oriented to time, place, and person Mood and affect: No depression, anxiety, or agitation     DATA REVIEWED:  Results for HARPREET, SIGNORE (MRN Emmit Pomfret) as of 11/19/2020 12:29  Ref. Range 11/19/2020  09:33  TSH Latest Ref Range: 0.35 - 4.50 uIU/mL 6.25 (H)  T4,Free(Direct) Latest Ref Range: 0.60 - 1.60 ng/dL 4.66    ASSESSMENT / PLAN / RECOMMENDATIONS:   1) Hyperthyroidism Secondary to Graves' Disease :  - She continues with occasional vomiting, was seen by GI 02/2020 not sure of the outcome.  - Her LFT's have been normal, she was advised to switch Methimazole to night time and see if that would help.  - She denies any local neck symptoms. - She already has been on Methimazole for almost 3 years. I have encouraged her to consider long term treatment with either RAI ablation vs surgery , but I am concerned about her eyes. We discussed RAI ablation could  exacerbate Graves' orbitopathy, I have again encouraged her to have an updated eye exam, a referral has been placed.  - She has self increased the methimazole dose which I have advised her against doing so, if she has symptoms , pt to contact us for an updated lab test  - TSH elevated, will reduce the dose as below  - Preconception counseling performed today , she is on [name]   Medications   Decrease Methimazole 5 mg, half a tablet daily     2.) Graves' Disease:  - Pt with a stare as well as occasional discomfort, I will refer her for an updated eye exam      F/u in 4 months    Signed electronically by: Lyndle Herrlich, MD  [facility]  [facility] [address] [place] [place], [place] [address] Phone: [phone] FAX: [phone]      CC: Janeece Agee, NP [address] [place] [address] Phone: [phone]  Fax: [phone]   Return to Endocrinology clinic as below: Future Appointments  Date Time Provider Department Center  12/05/2020  2:00 PM Levie Heritage, DO [facility] None  03/22/2021  2:20 PM Shamleffer, Konrad Dolores, MD [facility] None

## 2020-12-05 ENCOUNTER — Other Ambulatory Visit (HOSPITAL_COMMUNITY)
Admission: RE | Admit: 2020-12-05 | Discharge: 2020-12-05 | Disposition: A | Discharge status: Home / Self Care | Payer: 59 | Source: Ambulatory Visit | Attending: Family Medicine | Admitting: Family Medicine

## 2020-12-05 ENCOUNTER — Other Ambulatory Visit: Payer: Self-pay

## 2020-12-05 ENCOUNTER — Encounter: Payer: Self-pay | Admitting: Family Medicine

## 2020-12-05 ENCOUNTER — Ambulatory Visit (INDEPENDENT_AMBULATORY_CARE_PROVIDER_SITE_OTHER): Payer: 59 | Admitting: Family Medicine

## 2020-12-05 VITALS — BP 111/75 | HR 58 | Wt 154.0 lb

## 2020-12-05 DIAGNOSIS — Z7721 Contact with and (suspected) exposure to potentially hazardous body fluids: Secondary | ICD-10-CM

## 2020-12-05 DIAGNOSIS — R87612 Low grade squamous intraepithelial lesion on cytologic smear of cervix (LGSIL): Secondary | ICD-10-CM | POA: Insufficient documentation

## 2020-12-05 LAB — POCT URINE PREGNANCY: Preg Test, Ur: NEGATIVE

## 2020-12-05 MED ORDER — FLUCONAZOLE 150 MG PO TABS: 150.0000 mg | ORAL_TABLET | Freq: Once | ORAL | 0 refills | Status: AC

## 2020-12-05 NOTE — Progress Notes (Signed)
Patient Name: Nancy Mcconnell, female   DOB: Oct 27, 1995, 25 y.o.  MRN: [id number]  Colposcopy Procedure Note:  [id number] Pregnancy status: Unknown Indications: LSIL HPV:   Cervical History:  Previous Abnormal Pap: none  Previous Colposcopy: none  Previous LEEP or Cryo: none  Smoking: Never Smoked Hysterectomy: No   Patient given informed consent, signed copy in the chart, time out was performed.    Exam: Vulva and Vagina grossly normal.  Cervix viewed with speculum and colposcope after application of acetic acid:  Cervix Fully Visualized Squamocolumnar Junction Visibility: Fully visualized  Acetowhite lesions: 25mm area at 10 o'clock   Other Lesions: None Punctation: Not present  Mosaicism: Coarse Abnormal vasculature: No   Biopsies: 10 o'clock ECC: brush  Hemostasis achieved with:  Silver Nitrate  Colposcopy Impression:  CIN1   Patient was given post procedure instructions.  Will call patient with results.

## 2020-12-06 LAB — RPR: RPR Ser Ql: NONREACTIVE

## 2020-12-06 LAB — HIV ANTIBODY (ROUTINE TESTING W REFLEX): HIV Screen 4th Generation wRfx: NONREACTIVE

## 2020-12-06 LAB — HEPATITIS C ANTIBODY: Hep C Virus Ab: <0.1 s/co ratio (ref 0.0–0.9)

## 2020-12-06 LAB — HEPATITIS B SURFACE ANTIGEN: Hepatitis B Surface Ag: NEGATIVE

## 2020-12-07 LAB — CERVICOVAGINAL ANCILLARY ONLY
Bacterial Vaginitis (gardnerella): NEGATIVE
Candida Glabrata: NEGATIVE
Candida Vaginitis: NEGATIVE
Chlamydia: NEGATIVE
Comment: NEGATIVE
Comment: NEGATIVE
Comment: NEGATIVE
Comment: NEGATIVE
Comment: NORMAL
Neisseria Gonorrhea: NEGATIVE

## 2020-12-07 LAB — SURGICAL PATHOLOGY

## 2021-02-08 ENCOUNTER — Encounter: Payer: Self-pay | Admitting: Internal Medicine

## 2021-02-08 NOTE — Telephone Encounter (Signed)
Place in [facility] inbox

## 2021-03-06 ENCOUNTER — Other Ambulatory Visit: Payer: Self-pay | Admitting: Internal Medicine

## 2021-03-22 ENCOUNTER — Ambulatory Visit: Payer: 59 | Admitting: Internal Medicine

## 2021-04-02 ENCOUNTER — Ambulatory Visit: Payer: 59

## 2021-04-03 ENCOUNTER — Ambulatory Visit: Payer: 59

## 2021-04-03 ENCOUNTER — Other Ambulatory Visit (HOSPITAL_COMMUNITY)
Admission: RE | Admit: 2021-04-03 | Discharge: 2021-04-03 | Disposition: A | Discharge status: Home / Self Care | Payer: 59 | Source: Ambulatory Visit | Attending: Obstetrics & Gynecology | Admitting: Obstetrics & Gynecology

## 2021-04-03 ENCOUNTER — Other Ambulatory Visit: Payer: Self-pay

## 2021-04-03 VITALS — BP 131/82 | HR 97 | Wt 149.1 lb

## 2021-04-03 DIAGNOSIS — Z202 Contact with and (suspected) exposure to infections with a predominantly sexual mode of transmission: Secondary | ICD-10-CM | POA: Diagnosis present

## 2021-04-03 NOTE — Progress Notes (Signed)
SUBJECTIVE:  25 y.o. female complains of clear vaginal discharge for a couple of days. Denies abnormal vaginal bleeding or significant pelvic pain or fever. Patient is request HIV testing, her last testing was in [facility]. No UTI symptoms. Denies history of known exposure to STD.  No LMP recorded.  OBJECTIVE:  She appears well, afebrile. Urine dipstick: not done.  ASSESSMENT:  Vaginal Discharge: none  Vaginal Odor: none   PLAN:  GC, chlamydia, trichomonas, BVAG, CVAG probe sent to lab. Treatment: To be determined once lab results are received ROV prn if symptoms persist or worsen.

## 2021-04-04 LAB — HIV ANTIBODY (ROUTINE TESTING W REFLEX): HIV Screen 4th Generation wRfx: NONREACTIVE

## 2021-04-04 LAB — CERVICOVAGINAL ANCILLARY ONLY
Bacterial Vaginitis (gardnerella): NEGATIVE
Candida Glabrata: NEGATIVE
Candida Vaginitis: NEGATIVE
Chlamydia: NEGATIVE
Comment: NEGATIVE
Comment: NEGATIVE
Comment: NEGATIVE
Comment: NEGATIVE
Comment: NEGATIVE
Comment: NORMAL
Neisseria Gonorrhea: NEGATIVE
Trichomonas: NEGATIVE

## 2021-05-29 ENCOUNTER — Ambulatory Visit: Payer: 59 | Admitting: Internal Medicine

## 2021-05-29 NOTE — Progress Notes (Deleted)
Name: Nancy Mcconnell  MRN/ DOB: [id number], [birth year]    Age/ Sex: 25 y.o., female     PCP: Janeece Agee, NP   Reason for Endocrinology Evaluation: Hyperthyroidism     Initial Endocrinology Clinic Visit: 08/02/18    PATIENT IDENTIFIER: Nancy Mcconnell is a 25 y.o., female with a past medical history of Hyperthyrodidism. She has followed with [facility] since 08/02/18 for consultative assistance with management of her Hyperthyroidism.   HISTORICAL SUMMARY: Pt presented to the ED on 05/27/18 with c/o dizziness, nausea and vomiting. She was found to be tachycardic with abnormal liver enzymes. Labs revealed suppressed TSH at < 0.010 uIU/mL with elevated FT4. She was started on Methimazole in September 2019  An attempt to reduce her methimazole to 5 mg daily in January 2020 resulted in hyperthyroidism.  Cousin passed away with Lupus.     She is studying for [employer]   SUBJECTIVE:   Today (05/29/2021):  Nancy Mcconnell is here for 4 month follow up on hyperthyroidism . Pt on methimazole daily   She has been having vomiting for the past week  Denies heartburn  Denies diarrhea or constipation  Denies local neck symptoms    Methimazole 5 mg, half a tablet daily      HISTORY:  Past Medical History:  Past Medical History:  Diagnosis Date   Graves disease 05/2018   Insomnia 09/29/2019   Tachycardia 05/27/2018   Past Surgical History:  Past Surgical History:  Procedure Laterality Date   NO PAST SURGERIES     Social History:  reports that she has never smoked. She has never used smokeless tobacco. She reports current alcohol use. She reports that she does not use drugs. Family History: family history includes Healthy in her brother, brother, and mother; Heart failure in her father.   HOME MEDICATIONS: Allergies as of 05/29/2021   No Known Allergies      Medication List        Accurate as of May 29, 2021  7:36 AM. If you have any questions, ask  your nurse or doctor.          atenolol 25 MG tablet Commonly known as: TENORMIN TAKE 1 TABLET BY MOUTH EVERY DAY   methimazole 5 MG tablet Commonly known as: TAPAZOLE Take 0.5 tablets (2.5 mg total) by mouth daily.          OBJECTIVE:   PHYSICAL EXAM: VS: There were no vitals taken for this visit.   EXAM: General: Pt appears well and is in NAD  Eyes: External eye exam normal with a stare , no lid lag .  EOM intact.    Neck: General: Supple without adenopathy. Thyroid: Thyroid is prominent .  No goiter or nodules appreciated. No thyroid bruit.  Lungs: Clear with good BS bilat with no rales, rhonchi, or wheezes  Heart: Auscultation: RRR.  Abdomen: Normoactive bowel sounds, soft, nontender, without masses or organomegaly palpable  Extremities:  BL LE: No pretibial edema normal ROM and strength.  Mental Status: Judgment, insight: Intact Orientation: Oriented to time, place, and person Mood and affect: No depression, anxiety, or agitation     DATA REVIEWED:  Results for Nancy Mcconnell, Nancy Mcconnell (MRN [id number]) as of 11/19/2020 12:29  Ref. Range 11/19/2020 09:33  TSH Latest Ref Range: 0.35 - 4.50 uIU/mL 6.25 (H)  T4,Free(Direct) Latest Ref Range: 0.60 - 1.60 ng/dL 0.94    ASSESSMENT / PLAN / RECOMMENDATIONS:   1) Hyperthyroidism Secondary to Graves' Disease :   -  She continues with occasional vomiting, was seen by GI 02/2020 not sure of the outcome.  - Her LFT's have been normal, she was advised to switch Methimazole to night time and see if that would help.  - She denies any local neck symptoms. - She already has been on Methimazole for almost 3 years. I have encouraged her to consider long term treatment with either RAI ablation vs surgery , but I am concerned about her eyes. We discussed RAI ablation could exacerbate Graves' orbitopathy, I have again encouraged her to have an updated eye exam, a referral has been placed.  - She has self increased the methimazole dose which  I have advised her against doing so, if she has symptoms , pt to contact us for an updated lab test  - TSH elevated, will reduce the dose as below  - Preconception counseling performed today , she is on [name]   Medications   Decrease Methimazole 5 mg, half a tablet daily     2.) Graves' Disease:  - Pt with a stare as well as occasional discomfort, I will refer her for an updated eye exam      F/u in 4 months    Signed electronically by: Lyndle Herrlich, MD  [facility]  [facility] [address] [place] [place], [place] [address] Phone: [phone] FAX: [phone]      CC: Janeece Agee, NP [address] [place] [address] Phone: [phone]  Fax: [phone]   Return to Endocrinology clinic as below: Future Appointments  Date Time Provider Department Center  05/29/2021  8:50 AM Karne Ozga, Konrad Dolores, MD [facility] None

## 2021-06-02 ENCOUNTER — Other Ambulatory Visit: Payer: Self-pay | Admitting: Internal Medicine

## 2021-07-03 ENCOUNTER — Ambulatory Visit: Payer: 59 | Admitting: Internal Medicine

## 2021-07-11 ENCOUNTER — Ambulatory Visit: Payer: 59 | Admitting: Internal Medicine

## 2021-07-11 ENCOUNTER — Telehealth: Payer: Self-pay | Admitting: Internal Medicine

## 2021-07-11 NOTE — Progress Notes (Deleted)
Name: Nancy Mcconnell  MRN/ DOB: [id number], [birth year]    Age/ Sex: 25 y.o., female     PCP: Janeece Agee, NP   Reason for Endocrinology Evaluation: Hyperthyroidism     Initial Endocrinology Clinic Visit: 08/02/18    PATIENT IDENTIFIER: Nancy Mcconnell is a 25 y.o., female with a past medical history of Hyperthyrodidism. She has followed with [facility] since 08/02/18 for consultative assistance with management of her Hyperthyroidism.      HISTORICAL SUMMARY: Pt presented to the ED on 05/27/18 with c/o dizziness, nausea and vomiting. She was found to be tachycardic with abnormal liver enzymes. Labs revealed suppressed TSH at < 0.010 uIU/mL with elevated FT4. She was started on Methimazole in September 2019  An attempt to reduce her methimazole to 5 mg daily in January 2020 resulted in hyperthyroidism.  Cousin passed away with Lupus.     She is studying for [employer]   SUBJECTIVE:   Today (07/11/2021):  Nancy Mcconnell is here for 4 month follow up on hyperthyroidism . Pt on methimazole daily   She has been having vomiting for the past week  Denies heartburn  Denies diarrhea or constipation  Denies local neck symptoms    Methimazole 5 mg, half a tablet daily      HISTORY:  Past Medical History:  Past Medical History:  Diagnosis Date   Graves disease 05/2018   Insomnia 09/29/2019   Tachycardia 05/27/2018   Past Surgical History:  Past Surgical History:  Procedure Laterality Date   NO PAST SURGERIES     Social History:  reports that she has never smoked. She has never used smokeless tobacco. She reports current alcohol use. She reports that she does not use drugs. Family History: family history includes Healthy in her brother, brother, and mother; Heart failure in her father.   HOME MEDICATIONS: Allergies as of 07/11/2021   No Known Allergies      Medication List        Accurate as of July 11, 2021  7:20 AM. If you have any  questions, ask your nurse or doctor.          atenolol 25 MG tablet Commonly known as: TENORMIN TAKE 1 TABLET BY MOUTH EVERY DAY   methimazole 5 MG tablet Commonly known as: TAPAZOLE TAKE 1 TABLET (5 MG TOTAL) BY MOUTH AS DIRECTED. HALF A TABLET ON SATURDAYS AND SUNDAYS, 1 TABLET REST OF THE WEEK          OBJECTIVE:   PHYSICAL EXAM: VS: There were no vitals taken for this visit.   EXAM: General: Pt appears well and is in NAD  Eyes: External eye exam normal with a stare , no lid lag .  EOM intact.    Neck: General: Supple without adenopathy. Thyroid: Thyroid is prominent .  No goiter or nodules appreciated. No thyroid bruit.  Lungs: Clear with good BS bilat with no rales, rhonchi, or wheezes  Heart: Auscultation: RRR.  Abdomen: Normoactive bowel sounds, soft, nontender, without masses or organomegaly palpable  Extremities:  BL LE: No pretibial edema normal ROM and strength.  Mental Status: Judgment, insight: Intact Orientation: Oriented to time, place, and person Mood and affect: No depression, anxiety, or agitation     DATA REVIEWED:  Results for Nancy, Mcconnell (MRN [id number]) as of 11/19/2020 12:29  Ref. Range 11/19/2020 09:33  TSH Latest Ref Range: 0.35 - 4.50 uIU/mL 6.25 (H)  T4,Free(Direct) Latest Ref Range: 0.60 - 1.60 ng/dL 8.52  ASSESSMENT / PLAN / RECOMMENDATIONS:   1) Hyperthyroidism Secondary to Graves' Disease :   - She continues with occasional vomiting, was seen by GI 02/2020 not sure of the outcome.  - Her LFT's have been normal, she was advised to switch Methimazole to night time and see if that would help.  - She denies any local neck symptoms. -  Medications   Decrease Methimazole 5 mg, half a tablet daily     2.) Graves' Disease:  - Pt with a stare as well as occasional discomfort, I will refer her for an updated eye exam      F/u in 4 months    Signed electronically by: Lyndle Herrlich, MD  [facility]   [facility] [address] [place] [place], [place] [address] Phone: [phone] FAX: [phone]      CC: Janeece Agee, NP [address] [place] [address] Phone: [phone]  Fax: [phone]   Return to Endocrinology clinic as below: Future Appointments  Date Time Provider Department Center  07/11/2021  9:10 AM Adah Stoneberg, Konrad Dolores, MD [facility] None  07/25/2021  2:30 PM Levie Heritage, DO [facility] None

## 2021-07-11 NOTE — Telephone Encounter (Signed)
MEDICATION: Methimazole & Atenolol  PHARMACY:  [facility] on [address]   HAS THE PATIENT CONTACTED THEIR PHARMACY?  no  IS THIS A 90 DAY SUPPLY : unknown  IS PATIENT OUT OF MEDICATION: unknown  IF NOT; HOW MUCH IS LEFT: unknown  LAST APPOINTMENT DATE: @9 /18/2022  NEXT APPOINTMENT DATE:cancelled the 07/11/2021  DO WE HAVE YOUR PERMISSION TO LEAVE A DETAILED MESSAGE?: yes  OTHER COMMENTS: advised when she cancelled that we were not going to be able to reschedule per provider (see notes in appointment)   **Let patient know to contact pharmacy at the end of the day to make sure medication is ready. **  ** Please notify patient to allow 48-72 hours to process**  **Encourage patient to contact the pharmacy for refills or they can request refills through [facility]**

## 2021-07-11 NOTE — Telephone Encounter (Signed)
Patient notified and verbalized understanding. 

## 2021-07-25 ENCOUNTER — Ambulatory Visit: Payer: 59 | Admitting: Family Medicine

## 2021-09-04 ENCOUNTER — Ambulatory Visit: Payer: 59 | Admitting: Obstetrics & Gynecology

## 2021-10-09 ENCOUNTER — Ambulatory Visit: Payer: 59 | Admitting: Family Medicine

## 2021-11-06 ENCOUNTER — Ambulatory Visit: Payer: Self-pay | Admitting: Family Medicine

## 2022-01-09 ENCOUNTER — Other Ambulatory Visit (HOSPITAL_COMMUNITY)
Admission: RE | Admit: 2022-01-09 | Discharge: 2022-01-09 | Disposition: A | Discharge status: Home / Self Care | Payer: BC Managed Care – PPO | Source: Ambulatory Visit | Attending: Family Medicine | Admitting: Family Medicine

## 2022-01-09 ENCOUNTER — Encounter: Payer: Self-pay | Admitting: Family Medicine

## 2022-01-09 ENCOUNTER — Ambulatory Visit (INDEPENDENT_AMBULATORY_CARE_PROVIDER_SITE_OTHER): Payer: Managed Care, Other (non HMO) | Admitting: Family Medicine

## 2022-01-09 VITALS — BP 104/63 | HR 74 | Ht 65.0 in | Wt 151.0 lb

## 2022-01-09 DIAGNOSIS — G479 Sleep disorder, unspecified: Secondary | ICD-10-CM | POA: Diagnosis not present

## 2022-01-09 DIAGNOSIS — N898 Other specified noninflammatory disorders of vagina: Secondary | ICD-10-CM | POA: Diagnosis not present

## 2022-01-09 DIAGNOSIS — Z01419 Encounter for gynecological examination (general) (routine) without abnormal findings: Secondary | ICD-10-CM | POA: Diagnosis not present

## 2022-01-09 DIAGNOSIS — Z113 Encounter for screening for infections with a predominantly sexual mode of transmission: Secondary | ICD-10-CM | POA: Insufficient documentation

## 2022-01-09 DIAGNOSIS — E0590 Thyrotoxicosis, unspecified without thyrotoxic crisis or storm: Secondary | ICD-10-CM | POA: Diagnosis not present

## 2022-01-09 DIAGNOSIS — Z6825 Body mass index (BMI) 25.0-25.9, adult: Secondary | ICD-10-CM | POA: Diagnosis not present

## 2022-01-09 DIAGNOSIS — E0500 Thyrotoxicosis with diffuse goiter without thyrotoxic crisis or storm: Secondary | ICD-10-CM | POA: Diagnosis not present

## 2022-01-09 NOTE — Progress Notes (Signed)
? ? ?GYNECOLOGY ANNUAL PREVENTATIVE CARE ENCOUNTER NOTE ? ?Subjective:  ? Nancy Mcconnell is a 26 y.o. G0P0000 female here for a routine annual gynecologic exam.  Current complaints: vaginal discharge.   Denies abnormal vaginal bleeding, discharge, pelvic pain, problems with intercourse or other gynecologic concerns.  ?  ?Gynecologic History ?Patient's last menstrual period was 12/25/2021. ?Patient is sexually active  ?Contraception: [name] ?Last Pap: 2022. 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 ? ?The pregnancy intention screening data noted above was reviewed. Potential methods of contraception were discussed. The patient elected to proceed with No data recorded. ? ? ?Obstetric History ?OB History  ?Gravida Para Term Preterm AB Living  ?0 0 0 0 0 0  ?SAB IAB Ectopic Multiple Live Births  ?0 0 0 0 0  ? ? ?Past Medical History:  ?Diagnosis Date  ? Graves disease 05/2018  ? Insomnia 09/29/2019  ? Tachycardia 05/27/2018  ? ? ?Past Surgical History:  ?Procedure Laterality Date  ? NO PAST SURGERIES    ? ? ?Current Outpatient Medications on File Prior to Visit  ?Medication Sig Dispense Refill  ? methimazole (TAPAZOLE) 5 MG tablet TAKE 1 TABLET (5 MG TOTAL) BY MOUTH AS DIRECTED. HALF A TABLET ON SATURDAYS AND SUNDAYS, 1 TABLET REST OF THE WEEK 72 tablet 0  ? atenolol (TENORMIN) 25 MG tablet TAKE 1 TABLET BY MOUTH EVERY DAY 90 tablet 3  ? [DISCONTINUED] traZODone (DESYREL) 50 MG tablet Take 0.5-1 tablets (25-50 mg total) by mouth at bedtime as needed for sleep. 30 tablet 3  ? ?Current Facility-Administered Medications on File Prior to Visit  ?Medication Dose Route Frequency Provider Last Rate Last Admin  ? etonogestrel ([name]) implant 68 mg  68 mg Subdermal Once [name] D, PA      ? ? ?No Known Allergies ? ?Social History  ? ?Socioeconomic History  ? Marital status: Single  ?  Spouse name: Not on file  ? Number of children: 0  ? Years of education: Not on file  ? Highest education  level: Not on file  ?Occupational History  ? Not on file  ?Tobacco Use  ? Smoking status: Never  ? Smokeless tobacco: Never  ?Vaping Use  ? Vaping Use: Never used  ?Substance and Sexual Activity  ? Alcohol use: Yes  ?  Comment: occ  ? Drug use: No  ? Sexual activity: Yes  ?  Birth control/protection: Implant  ?Other Topics Concern  ? Not on file  ?Social History Narrative  ? ** Merged History Encounter **  ?    ? ?Social Determinants of Health  ? ?Financial Resource Strain: Not on file  ?Food Insecurity: Not on file  ?Transportation Needs: Not on file  ?Physical Activity: Not on file  ?Stress: Not on file  ?Social Connections: Not on file  ?Intimate Partner Violence: Not on file  ? ? ?Family History  ?Problem Relation Age of Onset  ? Healthy Mother   ? Heart failure Father   ? Healthy Brother   ? Healthy Brother   ? ? ?The following portions of the patient's history were reviewed and updated as appropriate: allergies, current medications, past family history, past medical history, past social history, past surgical history and problem list. ? ?Review of Systems ?Pertinent items are noted in HPI. ?  ?Objective:  ?BP 104/63   Pulse 74   Ht 5\' 5"  (1.651 m)   Wt 151 lb (68.5 kg)   LMP 12/25/2021   BMI  25.13 kg/m?  ?Wt Readings from Last 3 Encounters:  ?01/09/22 151 lb (68.5 kg)  ?04/03/21 149 lb 1.9 oz (67.6 kg)  ?12/05/20 154 lb (69.9 kg)  ?  ? ?Chaperone present during exam ? ?CONSTITUTIONAL: Well-developed, well-nourished female in no acute distress.  ?HENT:  Normocephalic, atraumatic, External right and left ear normal. Oropharynx is clear and moist ?EYES: Conjunctivae and EOM are normal. Pupils are equal, round, and reactive to light. No scleral icterus.  ?NECK: Normal range of motion, supple, no masses.  Normal thyroid.  ? ?CARDIOVASCULAR: Normal heart rate noted, regular rhythm ?RESPIRATORY: Clear to auscultation bilaterally. Effort and breath sounds normal, no problems with respiration noted. ?BREASTS:  Symmetric in size. No masses, skin changes, nipple drainage, or lymphadenopathy. ?ABDOMEN: Soft, normal bowel sounds, no distention noted.  No tenderness, rebound or guarding.  ?PELVIC: Normal appearing external genitalia; normal appearing vaginal mucosa and cervix.  No abnormal discharge noted.  Normal uterine size, no other palpable masses, no uterine or adnexal tenderness. ?MUSCULOSKELETAL: Normal range of motion. No tenderness.  No cyanosis, clubbing, or edema.  2+ distal pulses. ?SKIN: Skin is warm and dry. No rash noted. Not diaphoretic. No erythema. No pallor. ?NEUROLOGIC: Alert and oriented to person, place, and time. Normal reflexes, muscle tone coordination. No cranial nerve deficit noted. ?PSYCHIATRIC: Normal mood and affect. Normal behavior. Normal judgment and thought content. ? ?Assessment:  ?Annual gynecologic examination with pap smear ?  ?Plan:  ?1. Well Woman Exam ?Will follow up results of pap smear and manage accordingly. ?STD testing discussed. Patient requested testing ?- Cytology - PAP( [place]) ? ?2. Screening for STD (sexually transmitted disease) ?- Cytology - PAP( [place]) ?- RPR ?- Hepatitis C Antibody ?- Hepatitis B Surface AntiGEN ?- HIV antibody (with reflex) ? ?3. Vaginal discharge ?BV/yeast added to PAP ? ?Routine preventative health maintenance measures emphasized. ?Please refer to After Visit Summary for other counseling recommendations.  ? ? ?Candelaria Celeste, DO ?Center for [facility] ?

## 2022-01-10 LAB — HEPATITIS B SURFACE ANTIGEN: Hepatitis B Surface Ag: NEGATIVE

## 2022-01-10 LAB — HIV ANTIBODY (ROUTINE TESTING W REFLEX): HIV Screen 4th Generation wRfx: NONREACTIVE

## 2022-01-10 LAB — RPR: RPR Ser Ql: NONREACTIVE

## 2022-01-10 LAB — HEPATITIS C ANTIBODY: Hep C Virus Ab: NONREACTIVE

## 2022-01-14 LAB — CYTOLOGY - PAP
Chlamydia: NEGATIVE
Comment: NEGATIVE
Comment: NEGATIVE
Comment: NORMAL
Neisseria Gonorrhea: NEGATIVE
Trichomonas: NEGATIVE

## 2022-01-16 ENCOUNTER — Telehealth: Payer: Self-pay

## 2022-01-16 NOTE — Telephone Encounter (Signed)
Called pt to inform her that her Pap smear shows low grade cells and she will need a Colposcopy. Pt is scheduled for a Colpo on 03/07/22. Understanding was voiced. ?Mendy Chou l Cutter Passey, CMA  ?

## 2022-01-16 NOTE — Telephone Encounter (Signed)
-----   Message from Levie Heritage, DO sent at 01/15/2022  2:16 PM EDT ----- ?Needs to be scheduled for a colposcopy. ?

## 2022-03-06 ENCOUNTER — Encounter: Payer: BC Managed Care – PPO | Admitting: Family Medicine

## 2022-03-27 ENCOUNTER — Encounter: Payer: BC Managed Care – PPO | Admitting: Family Medicine

## 2022-05-01 ENCOUNTER — Encounter: Payer: BC Managed Care – PPO | Admitting: Family Medicine

## 2022-05-22 DIAGNOSIS — E0500 Thyrotoxicosis with diffuse goiter without thyrotoxic crisis or storm: Secondary | ICD-10-CM | POA: Diagnosis not present

## 2022-05-22 DIAGNOSIS — E0590 Thyrotoxicosis, unspecified without thyrotoxic crisis or storm: Secondary | ICD-10-CM | POA: Diagnosis not present

## 2022-06-05 ENCOUNTER — Encounter: Payer: BC Managed Care – PPO | Admitting: Family Medicine

## 2022-06-25 ENCOUNTER — Encounter: Payer: BC Managed Care – PPO | Admitting: Family Medicine

## 2022-07-31 ENCOUNTER — Encounter: Payer: BC Managed Care – PPO | Admitting: Family Medicine

## 2022-08-20 DIAGNOSIS — E0590 Thyrotoxicosis, unspecified without thyrotoxic crisis or storm: Secondary | ICD-10-CM | POA: Diagnosis not present

## 2022-09-01 ENCOUNTER — Encounter: Payer: Self-pay | Admitting: General Practice

## 2022-09-01 ENCOUNTER — Encounter: Payer: Self-pay | Admitting: Obstetrics and Gynecology

## 2022-09-01 ENCOUNTER — Ambulatory Visit: Payer: BC Managed Care – PPO | Admitting: Obstetrics and Gynecology

## 2022-09-01 ENCOUNTER — Other Ambulatory Visit (HOSPITAL_COMMUNITY)
Admission: RE | Admit: 2022-09-01 | Discharge: 2022-09-01 | Disposition: A | Discharge status: Home / Self Care | Payer: BC Managed Care – PPO | Source: Ambulatory Visit | Attending: Obstetrics and Gynecology | Admitting: Obstetrics and Gynecology

## 2022-09-01 VITALS — BP 119/76 | HR 81 | Wt 152.0 lb

## 2022-09-01 DIAGNOSIS — Z113 Encounter for screening for infections with a predominantly sexual mode of transmission: Secondary | ICD-10-CM

## 2022-09-01 DIAGNOSIS — N898 Other specified noninflammatory disorders of vagina: Secondary | ICD-10-CM | POA: Insufficient documentation

## 2022-09-01 DIAGNOSIS — Z3046 Encounter for surveillance of implantable subdermal contraceptive: Secondary | ICD-10-CM

## 2022-09-01 MED ORDER — ETONOGESTREL 68 MG ~~LOC~~ IMPL
68.0000 mg | DRUG_IMPLANT | Freq: Once | SUBCUTANEOUS | Status: AC
Start: 2022-09-01 — End: 2022-09-01
  Administered 2022-09-01: 68 mg via SUBCUTANEOUS

## 2022-09-01 NOTE — Progress Notes (Signed)
Patient presents for [name] exchange. Armandina Stammer RN

## 2022-09-01 NOTE — Progress Notes (Signed)
GYNECOLOGY VISIT  Patient name: Nancy Mcconnell MRN [id number]  Date of birth: 10-02-1995 Chief Complaint:   Contraception  History:  Nancy Mcconnell is a 26 y.o. G0P0000 being seen today for nexplanon exchange. Has had nexplanon x3 years and had one before this without issues. May have spotting at the end/start of a nexplanon. Also reports having some vaginal discharge and requests STI screening.   Past Medical History:  Diagnosis Date   Graves disease 05/2018   Insomnia 09/29/2019   Tachycardia 05/27/2018    Past Surgical History:  Procedure Laterality Date   NO PAST SURGERIES      The following portions of the patient's history were reviewed and updated as appropriate: allergies, current medications, past family history, past medical history, past social history, past surgical history and problem list.   Health Maintenance:   Last pap 12/2021. Results were: LSIL w/ HRHPV not done. H/O abnormal pap: yes Last mammogram: n/a   Review of Systems:  Pertinent items are noted in HPI. Comprehensive review of systems was otherwise negative.   Objective:  Physical Exam BP 119/76   Pulse 81   Wt 152 lb (68.9 kg)   LMP 08/25/2022   BMI 25.29 kg/m    Physical Exam Vitals and nursing note reviewed.  Constitutional:      Appearance: Normal appearance.  HENT:     Head: Normocephalic and atraumatic.  Cardiovascular:     Rate and Rhythm: Normal rate and regular rhythm.  Pulmonary:     Effort: Pulmonary effort is normal.     Breath sounds: Normal breath sounds.  Skin:    General: Skin is warm and dry.  Neurological:     General: No focal deficit present.     Mental Status: She is alert.  Psychiatric:        Mood and Affect: Mood normal.        Behavior: Behavior normal.        Thought Content: Thought content normal.        Judgment: Judgment normal.    [name] Removal and Reinsertion Patient identified, informed consent performed, consent signed.   Patient  does understand that irregular bleeding is a very common side effect of this medication.  Pregnancy test in clinic today was negative.  Appropriate time out taken. [name] site identified in left arm.  Area prepped in usual sterile fashon. One ml of 1% lidocaine was used to anesthetize the area at the distal end of the implant. A small stab incision was made right beside the implant on the distal portion. The [name] rod was grasped using hemostats and removed without difficulty. There was minimal blood loss. There were no complications. Area was then injected with 3 ml of 1 % lidocaine. She was re-prepped with betadine, [name] removed from packaging, Device confirmed in needle, then inserted full length of needle and withdrawn per handbook instructions. [name] was able to palpated in the patient's arm; patient palpated the insert herself.  There was minimal blood loss. Patient insertion site covered with gauze and a pressure bandage to reduce any bruising. The patient tolerated the procedure well and was given post procedure instructions.       Assessment & Plan:  1. Encounter for removal and reinsertion of [name] S/p uncomplicated removal and reinsertion of device  2. Vaginal discharge Vaginitis screen - Cervicovaginal ancillary only( [place])  3. Screening for STD (sexually transmitted disease) Serum screening per request - HIV antibody (with reflex) - RPR -  Hepatitis B Surface AntiGEN - Hepatitis C Antibody   Routine preventative health maintenance measures emphasized.  Lorriane Shire, MD Minimally Invasive Gynecologic Surgery Center for [facility], [facility]

## 2022-09-02 LAB — CERVICOVAGINAL ANCILLARY ONLY
Bacterial Vaginitis (gardnerella): NEGATIVE
Candida Glabrata: NEGATIVE
Candida Vaginitis: NEGATIVE
Chlamydia: NEGATIVE
Comment: NEGATIVE
Comment: NEGATIVE
Comment: NEGATIVE
Comment: NEGATIVE
Comment: NEGATIVE
Comment: NORMAL
Neisseria Gonorrhea: NEGATIVE
Trichomonas: NEGATIVE

## 2022-09-02 LAB — HEPATITIS B SURFACE ANTIGEN: Hepatitis B Surface Ag: NEGATIVE

## 2022-09-02 LAB — RPR: RPR Ser Ql: NONREACTIVE

## 2022-09-02 LAB — HEPATITIS C ANTIBODY: Hep C Virus Ab: NONREACTIVE

## 2022-09-02 LAB — HIV ANTIBODY (ROUTINE TESTING W REFLEX): HIV Screen 4th Generation wRfx: NONREACTIVE

## 2022-09-11 ENCOUNTER — Encounter: Payer: Self-pay | Admitting: General Practice

## 2022-09-11 ENCOUNTER — Other Ambulatory Visit (HOSPITAL_COMMUNITY)
Admission: RE | Admit: 2022-09-11 | Discharge: 2022-09-11 | Disposition: A | Discharge status: Home / Self Care | Payer: BC Managed Care – PPO | Source: Ambulatory Visit | Attending: Family Medicine | Admitting: Family Medicine

## 2022-09-11 ENCOUNTER — Ambulatory Visit: Payer: BC Managed Care – PPO | Admitting: Family Medicine

## 2022-09-11 ENCOUNTER — Encounter: Payer: Self-pay | Admitting: Family Medicine

## 2022-09-11 VITALS — BP 113/74 | HR 68 | Ht 65.0 in | Wt 149.0 lb

## 2022-09-11 DIAGNOSIS — R87612 Low grade squamous intraepithelial lesion on cytologic smear of cervix (LGSIL): Secondary | ICD-10-CM | POA: Insufficient documentation

## 2022-09-11 DIAGNOSIS — N72 Inflammatory disease of cervix uteri: Secondary | ICD-10-CM | POA: Diagnosis not present

## 2022-09-11 DIAGNOSIS — N870 Mild cervical dysplasia: Secondary | ICD-10-CM | POA: Diagnosis not present

## 2022-09-11 NOTE — Progress Notes (Signed)
Patient Name: Nancy Mcconnell, female   DOB: Nov 22, 1995, 26 y.o.  MRN: [id number]  Colposcopy Procedure Note:  G0P0000 Pregnancy status: Unknown Lab Results  Component Value Date   DIAGPAP - Low grade squamous intraepithelial lesion (LSIL) (A) 01/09/2022   DIAGPAP - Low grade squamous intraepithelial lesion (LSIL) (A) 07/19/2020   DIAGPAP (A) 05/06/2019    -  LOW GRADE SQUAMOUS INTRAEPITHELIAL LESION: CIN-1/ HPV (LSIL)    Cervical History: Previous Abnormal Pap: LSIL Previous Colposcopy: none Previous LEEP or Cryo: none  Smoking: Never Smoked Hysterectomy: No Other History:   Patient given informed consent, signed copy in the chart, time out was performed.    Exam: Vulva and Vagina grossly normal.  Cervix viewed with speculum and colposcope after application of acetic acid:  Cervix Fully Visualized Squamocolumnar Junction Visibility: Fully visualized  Acetowhite lesions: 7 and 12 o'clock  Other Lesions: None Punctation: Not present  Mosaicism: Coarse Abnormal vasculature: No   Biopsies: 7 and 12 o'clock ECC: Yes - Curette and Brush  Hemostasis achieved with:  Silver Nitrate  Colposcopy Impression:   CIN2    Patient was given post procedure instructions.  Will call patient with results.

## 2022-09-16 LAB — SURGICAL PATHOLOGY

## 2022-09-19 ENCOUNTER — Telehealth: Payer: Self-pay

## 2022-09-19 NOTE — Telephone Encounter (Signed)
-----   Message from Truett Mainland, DO sent at 09/18/2022  9:36 AM EST ----- Needs annual/PAP in 1 year

## 2022-09-19 NOTE — Telephone Encounter (Signed)
Patient called and made aware the need to repeat pap smear in one year. Kathrene Alu RN

## 2022-10-09 DIAGNOSIS — E0590 Thyrotoxicosis, unspecified without thyrotoxic crisis or storm: Secondary | ICD-10-CM | POA: Diagnosis not present

## 2022-11-25 ENCOUNTER — Encounter: Payer: Self-pay | Admitting: General Practice

## 2022-11-28 ENCOUNTER — Other Ambulatory Visit (HOSPITAL_COMMUNITY): Payer: Self-pay | Admitting: Nurse Practitioner

## 2022-11-28 DIAGNOSIS — E0500 Thyrotoxicosis with diffuse goiter without thyrotoxic crisis or storm: Secondary | ICD-10-CM

## 2022-12-18 ENCOUNTER — Ambulatory Visit (HOSPITAL_COMMUNITY): Admission: RE | Admit: 2022-12-18 | Payer: Medicaid Other | Source: Ambulatory Visit

## 2022-12-18 ENCOUNTER — Encounter (HOSPITAL_COMMUNITY): Payer: Medicaid Other

## 2022-12-18 ENCOUNTER — Encounter (HOSPITAL_COMMUNITY): Payer: Self-pay

## 2022-12-19 ENCOUNTER — Encounter (HOSPITAL_COMMUNITY): Payer: Medicaid Other

## 2023-02-09 ENCOUNTER — Encounter: Payer: Self-pay | Admitting: Family Medicine

## 2023-02-18 ENCOUNTER — Ambulatory Visit: Payer: Medicaid Other | Admitting: Family Medicine

## 2023-02-18 ENCOUNTER — Other Ambulatory Visit (HOSPITAL_COMMUNITY)
Admission: RE | Admit: 2023-02-18 | Discharge: 2023-02-18 | Disposition: A | Discharge status: Home / Self Care | Payer: Medicaid Other | Source: Ambulatory Visit | Attending: Family Medicine | Admitting: Family Medicine

## 2023-02-18 ENCOUNTER — Encounter: Payer: Self-pay | Admitting: Family Medicine

## 2023-02-18 VITALS — BP 112/71 | HR 73 | Wt 154.0 lb

## 2023-02-18 DIAGNOSIS — Z113 Encounter for screening for infections with a predominantly sexual mode of transmission: Secondary | ICD-10-CM | POA: Diagnosis present

## 2023-02-18 DIAGNOSIS — N921 Excessive and frequent menstruation with irregular cycle: Secondary | ICD-10-CM | POA: Diagnosis not present

## 2023-02-18 DIAGNOSIS — Z975 Presence of (intrauterine) contraceptive device: Secondary | ICD-10-CM | POA: Diagnosis not present

## 2023-02-18 MED ORDER — NORETHINDRONE ACETATE 5 MG PO TABS: 2.5000 mg | ORAL_TABLET | Freq: Every day | ORAL | 2 refills | Status: DC | PRN

## 2023-02-18 NOTE — Progress Notes (Signed)
   Subjective:    Patient ID: Nancy Mcconnell, female    DOB: 1995-10-07, 27 y.o.   MRN: [id number]  HPI Patient seen for 30 days of bleeding.  It does appear that the bleeding is slowing down.  So far today, the patient has not had any bleeding.  The bleeding has been moderate.  She did have a [name] replaced in December.  This is her first abnormal bleeding episode.   Review of Systems     Objective:   Physical Exam Vitals reviewed.  Constitutional:      Appearance: Normal appearance.  Cardiovascular:     Rate and Rhythm: Normal rate and regular rhythm.  Pulmonary:     Effort: Pulmonary effort is normal.     Breath sounds: Normal breath sounds.  Neurological:     Mental Status: She is alert.  Psychiatric:        Mood and Affect: Mood normal.        Behavior: Behavior normal.        Thought Content: Thought content normal.        Judgment: Judgment normal.       Assessment & Plan:  1. Breakthrough bleeding on [name] Will give the patient aygestin 2.5mg -5mg  daily prn. Discussed that some people will have abnormal bleeding with the nexplanon and hopefully will be controlled with the additional progesterone.  2. Screening for STD (sexually transmitted disease) - Cervicovaginal ancillary only( [place])

## 2023-02-19 LAB — CERVICOVAGINAL ANCILLARY ONLY
Bacterial Vaginitis (gardnerella): NEGATIVE
Candida Glabrata: NEGATIVE
Candida Vaginitis: NEGATIVE
Chlamydia: NEGATIVE
Comment: NEGATIVE
Comment: NEGATIVE
Comment: NEGATIVE
Comment: NEGATIVE
Comment: NEGATIVE
Comment: NORMAL
Neisseria Gonorrhea: NEGATIVE
Trichomonas: NEGATIVE

## 2023-04-18 ENCOUNTER — Other Ambulatory Visit: Payer: Self-pay | Admitting: Family Medicine

## 2023-11-24 DIAGNOSIS — E0500 Thyrotoxicosis with diffuse goiter without thyrotoxic crisis or storm: Secondary | ICD-10-CM | POA: Diagnosis not present

## 2023-11-24 DIAGNOSIS — E0590 Thyrotoxicosis, unspecified without thyrotoxic crisis or storm: Secondary | ICD-10-CM | POA: Diagnosis not present

## 2024-03-08 ENCOUNTER — Other Ambulatory Visit (HOSPITAL_COMMUNITY)
Admission: RE | Admit: 2024-03-08 | Discharge: 2024-03-08 | Disposition: A | Discharge status: Home / Self Care | Source: Ambulatory Visit | Attending: Family Medicine | Admitting: Family Medicine

## 2024-03-08 ENCOUNTER — Ambulatory Visit

## 2024-03-08 VITALS — BP 107/63 | HR 63 | Wt 169.0 lb

## 2024-03-08 DIAGNOSIS — Z113 Encounter for screening for infections with a predominantly sexual mode of transmission: Secondary | ICD-10-CM

## 2024-03-08 NOTE — Progress Notes (Signed)
 Patient presents for routine STI screening. Patient was sent to the lab to have labs drawn. Urine sample for  GC/Chlamydia was sent to the lab. Adali Pennings l Jacquelyne Quarry, CMA

## 2024-03-09 ENCOUNTER — Ambulatory Visit: Payer: Self-pay | Admitting: Family Medicine

## 2024-03-09 LAB — HEPATITIS C ANTIBODY: Hep C Virus Ab: NONREACTIVE

## 2024-03-09 LAB — HIV ANTIBODY (ROUTINE TESTING W REFLEX): HIV Screen 4th Generation wRfx: NONREACTIVE

## 2024-03-09 LAB — RPR: RPR Ser Ql: NONREACTIVE

## 2024-03-09 LAB — HEPATITIS B SURFACE ANTIGEN: Hepatitis B Surface Ag: NEGATIVE

## 2024-03-10 LAB — CERVICOVAGINAL ANCILLARY ONLY
Chlamydia: NEGATIVE
Comment: NEGATIVE
Comment: NEGATIVE
Comment: NORMAL
Neisseria Gonorrhea: NEGATIVE
Trichomonas: NEGATIVE

## 2024-04-28 ENCOUNTER — Ambulatory Visit: Admitting: Obstetrics and Gynecology

## 2024-09-29 ENCOUNTER — Ambulatory Visit: Admitting: Family Medicine

## 2024-11-17 ENCOUNTER — Ambulatory Visit: Admitting: Family Medicine
